# Patient Record
Sex: Male | Born: 1950
Health system: Southern US, Community
[De-identification: ages and names within clinical notes are randomized; demographics above are authoritative.]

## PROBLEM LIST (undated history)

## (undated) DIAGNOSIS — K635 Polyp of colon: Secondary | ICD-10-CM

## (undated) DIAGNOSIS — F419 Anxiety disorder, unspecified: Secondary | ICD-10-CM

## (undated) DIAGNOSIS — R7301 Impaired fasting glucose: Secondary | ICD-10-CM

## (undated) DIAGNOSIS — F988 Other specified behavioral and emotional disorders with onset usually occurring in childhood and adolescence: Secondary | ICD-10-CM

## (undated) DIAGNOSIS — J189 Pneumonia, unspecified organism: Secondary | ICD-10-CM

## (undated) DIAGNOSIS — T7840XA Allergy, unspecified, initial encounter: Secondary | ICD-10-CM

## (undated) DIAGNOSIS — C801 Malignant (primary) neoplasm, unspecified: Secondary | ICD-10-CM

## (undated) DIAGNOSIS — F329 Major depressive disorder, single episode, unspecified: Secondary | ICD-10-CM

## (undated) DIAGNOSIS — I10 Essential (primary) hypertension: Secondary | ICD-10-CM

## (undated) DIAGNOSIS — F32A Depression, unspecified: Secondary | ICD-10-CM

## (undated) DIAGNOSIS — E785 Hyperlipidemia, unspecified: Secondary | ICD-10-CM

## (undated) DIAGNOSIS — M1990 Unspecified osteoarthritis, unspecified site: Secondary | ICD-10-CM

## (undated) HISTORY — DX: Malignant (primary) neoplasm, unspecified: C80.1

## (undated) HISTORY — DX: Pneumonia, unspecified organism: J18.9

## (undated) HISTORY — DX: Allergy, unspecified, initial encounter: T78.40XA

## (undated) HISTORY — DX: Depression, unspecified: F32.A

## (undated) HISTORY — DX: Unspecified osteoarthritis, unspecified site: M19.90

## (undated) HISTORY — DX: Anxiety disorder, unspecified: F41.9

## (undated) HISTORY — DX: Essential (primary) hypertension: I10

## (undated) HISTORY — DX: Polyp of colon: K63.5

## (undated) HISTORY — PX: PILONIDAL CYST EXCISION: SHX744

## (undated) HISTORY — PX: VASECTOMY: SHX75

## (undated) HISTORY — DX: Hyperlipidemia, unspecified: E78.5

## (undated) HISTORY — DX: Impaired fasting glucose: R73.01

## (undated) HISTORY — DX: Major depressive disorder, single episode, unspecified: F32.9

## (undated) HISTORY — DX: Other specified behavioral and emotional disorders with onset usually occurring in childhood and adolescence: F98.8

## (undated) HISTORY — PX: OTHER SURGICAL HISTORY: SHX169

---

## 2004-07-18 ENCOUNTER — Ambulatory Visit: Payer: Self-pay | Admitting: Internal Medicine

## 2004-07-30 ENCOUNTER — Ambulatory Visit: Payer: Self-pay | Admitting: Internal Medicine

## 2004-08-03 ENCOUNTER — Ambulatory Visit: Payer: Self-pay | Admitting: Internal Medicine

## 2004-08-08 ENCOUNTER — Encounter (INDEPENDENT_AMBULATORY_CARE_PROVIDER_SITE_OTHER): Payer: Self-pay | Admitting: *Deleted

## 2004-08-08 ENCOUNTER — Ambulatory Visit: Payer: Self-pay | Admitting: Internal Medicine

## 2005-01-21 HISTORY — PX: OTHER SURGICAL HISTORY: SHX169

## 2006-02-19 ENCOUNTER — Ambulatory Visit: Payer: Self-pay | Admitting: Internal Medicine

## 2006-06-11 DIAGNOSIS — F988 Other specified behavioral and emotional disorders with onset usually occurring in childhood and adolescence: Secondary | ICD-10-CM | POA: Insufficient documentation

## 2006-06-11 DIAGNOSIS — F429 Obsessive-compulsive disorder, unspecified: Secondary | ICD-10-CM | POA: Insufficient documentation

## 2006-08-06 ENCOUNTER — Telehealth (INDEPENDENT_AMBULATORY_CARE_PROVIDER_SITE_OTHER): Payer: Self-pay | Admitting: *Deleted

## 2006-09-26 ENCOUNTER — Telehealth (INDEPENDENT_AMBULATORY_CARE_PROVIDER_SITE_OTHER): Payer: Self-pay | Admitting: *Deleted

## 2006-10-21 ENCOUNTER — Telehealth: Payer: Self-pay | Admitting: Internal Medicine

## 2006-12-09 ENCOUNTER — Telehealth (INDEPENDENT_AMBULATORY_CARE_PROVIDER_SITE_OTHER): Payer: Self-pay | Admitting: *Deleted

## 2007-01-01 ENCOUNTER — Ambulatory Visit: Payer: Self-pay | Admitting: Internal Medicine

## 2007-01-01 DIAGNOSIS — I10 Essential (primary) hypertension: Secondary | ICD-10-CM | POA: Insufficient documentation

## 2007-01-01 DIAGNOSIS — E8881 Metabolic syndrome: Secondary | ICD-10-CM

## 2007-01-01 DIAGNOSIS — Z720 Tobacco use: Secondary | ICD-10-CM

## 2007-01-01 DIAGNOSIS — E7800 Pure hypercholesterolemia, unspecified: Secondary | ICD-10-CM | POA: Insufficient documentation

## 2007-01-01 DIAGNOSIS — E782 Mixed hyperlipidemia: Secondary | ICD-10-CM

## 2007-01-01 DIAGNOSIS — F17210 Nicotine dependence, cigarettes, uncomplicated: Secondary | ICD-10-CM | POA: Insufficient documentation

## 2007-01-02 ENCOUNTER — Encounter (INDEPENDENT_AMBULATORY_CARE_PROVIDER_SITE_OTHER): Payer: Self-pay | Admitting: *Deleted

## 2007-01-05 ENCOUNTER — Encounter (INDEPENDENT_AMBULATORY_CARE_PROVIDER_SITE_OTHER): Payer: Self-pay | Admitting: *Deleted

## 2007-03-02 ENCOUNTER — Telehealth (INDEPENDENT_AMBULATORY_CARE_PROVIDER_SITE_OTHER): Payer: Self-pay | Admitting: *Deleted

## 2007-05-05 ENCOUNTER — Telehealth (INDEPENDENT_AMBULATORY_CARE_PROVIDER_SITE_OTHER): Payer: Self-pay | Admitting: *Deleted

## 2007-06-04 ENCOUNTER — Encounter: Payer: Self-pay | Admitting: Internal Medicine

## 2007-06-05 ENCOUNTER — Ambulatory Visit: Payer: Self-pay | Admitting: Internal Medicine

## 2007-06-05 LAB — CONVERTED CEMR LAB: Glucose, Bld: 104 mg/dL — ABNORMAL HIGH (ref 70–99)

## 2007-06-09 ENCOUNTER — Encounter: Payer: Self-pay | Admitting: Internal Medicine

## 2007-06-11 ENCOUNTER — Encounter (INDEPENDENT_AMBULATORY_CARE_PROVIDER_SITE_OTHER): Payer: Self-pay | Admitting: *Deleted

## 2007-06-16 ENCOUNTER — Encounter (INDEPENDENT_AMBULATORY_CARE_PROVIDER_SITE_OTHER): Payer: Self-pay | Admitting: *Deleted

## 2007-06-17 ENCOUNTER — Telehealth (INDEPENDENT_AMBULATORY_CARE_PROVIDER_SITE_OTHER): Payer: Self-pay | Admitting: *Deleted

## 2007-07-30 ENCOUNTER — Telehealth (INDEPENDENT_AMBULATORY_CARE_PROVIDER_SITE_OTHER): Payer: Self-pay | Admitting: *Deleted

## 2007-10-30 ENCOUNTER — Telehealth (INDEPENDENT_AMBULATORY_CARE_PROVIDER_SITE_OTHER): Payer: Self-pay | Admitting: *Deleted

## 2007-12-22 ENCOUNTER — Telehealth (INDEPENDENT_AMBULATORY_CARE_PROVIDER_SITE_OTHER): Payer: Self-pay | Admitting: *Deleted

## 2008-01-26 ENCOUNTER — Ambulatory Visit: Payer: Self-pay | Admitting: Internal Medicine

## 2008-01-26 DIAGNOSIS — D179 Benign lipomatous neoplasm, unspecified: Secondary | ICD-10-CM | POA: Insufficient documentation

## 2008-01-26 DIAGNOSIS — M6740 Ganglion, unspecified site: Secondary | ICD-10-CM | POA: Insufficient documentation

## 2008-02-01 ENCOUNTER — Encounter: Payer: Self-pay | Admitting: Internal Medicine

## 2008-02-12 ENCOUNTER — Telehealth (INDEPENDENT_AMBULATORY_CARE_PROVIDER_SITE_OTHER): Payer: Self-pay | Admitting: *Deleted

## 2008-02-12 ENCOUNTER — Ambulatory Visit (HOSPITAL_BASED_OUTPATIENT_CLINIC_OR_DEPARTMENT_OTHER): Admission: RE | Admit: 2008-02-12 | Discharge: 2008-02-12 | Payer: Self-pay | Admitting: Orthopedic Surgery

## 2008-02-19 ENCOUNTER — Encounter: Payer: Self-pay | Admitting: Internal Medicine

## 2008-05-13 ENCOUNTER — Encounter (INDEPENDENT_AMBULATORY_CARE_PROVIDER_SITE_OTHER): Payer: Self-pay | Admitting: *Deleted

## 2008-06-07 ENCOUNTER — Ambulatory Visit: Payer: Self-pay | Admitting: Internal Medicine

## 2008-06-07 DIAGNOSIS — Z860100 Personal history of colon polyps, unspecified: Secondary | ICD-10-CM | POA: Insufficient documentation

## 2008-08-22 ENCOUNTER — Telehealth (INDEPENDENT_AMBULATORY_CARE_PROVIDER_SITE_OTHER): Payer: Self-pay | Admitting: *Deleted

## 2008-10-07 ENCOUNTER — Ambulatory Visit: Payer: Self-pay | Admitting: Internal Medicine

## 2008-10-07 LAB — CONVERTED CEMR LAB: Rapid Strep: NEGATIVE

## 2008-10-12 ENCOUNTER — Ambulatory Visit: Payer: Self-pay | Admitting: Internal Medicine

## 2008-10-12 LAB — CONVERTED CEMR LAB
Glucose, Urine, Semiquant: NEGATIVE
Ketones, urine, test strip: NEGATIVE
Nitrite: NEGATIVE
Protein, U semiquant: NEGATIVE
Specific Gravity, Urine: <1.005
Urobilinogen, UA: NEGATIVE

## 2008-10-13 ENCOUNTER — Telehealth (INDEPENDENT_AMBULATORY_CARE_PROVIDER_SITE_OTHER): Payer: Self-pay | Admitting: *Deleted

## 2008-10-14 ENCOUNTER — Telehealth (INDEPENDENT_AMBULATORY_CARE_PROVIDER_SITE_OTHER): Payer: Self-pay | Admitting: *Deleted

## 2008-10-14 LAB — CONVERTED CEMR LAB: PSA: 3.78 ng/mL (ref 0.10–4.00)

## 2008-11-21 ENCOUNTER — Telehealth (INDEPENDENT_AMBULATORY_CARE_PROVIDER_SITE_OTHER): Payer: Self-pay | Admitting: *Deleted

## 2008-12-26 ENCOUNTER — Telehealth (INDEPENDENT_AMBULATORY_CARE_PROVIDER_SITE_OTHER): Payer: Self-pay | Admitting: *Deleted

## 2009-01-21 HISTORY — PX: CYSTECTOMY: SUR359

## 2009-02-24 ENCOUNTER — Telehealth (INDEPENDENT_AMBULATORY_CARE_PROVIDER_SITE_OTHER): Payer: Self-pay | Admitting: *Deleted

## 2009-05-22 ENCOUNTER — Telehealth (INDEPENDENT_AMBULATORY_CARE_PROVIDER_SITE_OTHER): Payer: Self-pay | Admitting: *Deleted

## 2009-07-19 ENCOUNTER — Encounter (INDEPENDENT_AMBULATORY_CARE_PROVIDER_SITE_OTHER): Payer: Self-pay | Admitting: *Deleted

## 2009-08-02 ENCOUNTER — Encounter: Payer: Self-pay | Admitting: Internal Medicine

## 2009-08-07 ENCOUNTER — Telehealth (INDEPENDENT_AMBULATORY_CARE_PROVIDER_SITE_OTHER): Payer: Self-pay | Admitting: *Deleted

## 2009-10-09 ENCOUNTER — Ambulatory Visit: Payer: Self-pay | Admitting: Internal Medicine

## 2009-10-09 DIAGNOSIS — M545 Low back pain: Secondary | ICD-10-CM

## 2009-10-11 ENCOUNTER — Ambulatory Visit: Payer: Self-pay | Admitting: Internal Medicine

## 2009-10-13 LAB — CONVERTED CEMR LAB
ALT: 41 units/L (ref 0–53)
AST: 31 units/L (ref 0–37)
Albumin: 4.2 g/dL (ref 3.5–5.2)
Alkaline Phosphatase: 61 units/L (ref 39–117)
BUN: 14 mg/dL (ref 6–23)
Basophils Absolute: 0.1 10*3/uL (ref 0.0–0.1)
Basophils Relative: 0.9 % (ref 0.0–3.0)
Calcium: 10.1 mg/dL (ref 8.4–10.5)
Chloride: 106 meq/L (ref 96–112)
Cholesterol: 200 mg/dL (ref 0–200)
Eosinophils Relative: 2.7 % (ref 0.0–5.0)
GFR calc non Af Amer: 81.82 mL/min (ref >60)
HCT: 45.7 % (ref 39.0–52.0)
Hemoglobin: 16.2 g/dL (ref 13.0–17.0)
Lymphocytes Relative: 36.2 % (ref 12.0–46.0)
Lymphs Abs: 2.7 10*3/uL (ref 0.7–4.0)
MCHC: 35.4 g/dL (ref 30.0–36.0)
Monocytes Absolute: 0.8 10*3/uL (ref 0.1–1.0)
Monocytes Relative: 10.3 % (ref 3.0–12.0)
Neutro Abs: 3.8 10*3/uL (ref 1.4–7.7)
Neutrophils Relative %: 49.9 % (ref 43.0–77.0)
PSA: 2.05 ng/mL (ref 0.10–4.00)
Platelets: 189 10*3/uL (ref 150.0–400.0)
RBC: 4.32 M/uL (ref 4.22–5.81)
RDW: 13.4 % (ref 11.5–14.6)
Sodium: 142 meq/L (ref 135–145)
Total Protein: 6.9 g/dL (ref 6.0–8.3)
Triglycerides: 133 mg/dL (ref 0.0–149.0)

## 2009-11-30 ENCOUNTER — Telehealth (INDEPENDENT_AMBULATORY_CARE_PROVIDER_SITE_OTHER): Payer: Self-pay | Admitting: *Deleted

## 2010-01-21 HISTORY — PX: ABSCESS DRAINAGE: SHX1119

## 2010-01-31 ENCOUNTER — Telehealth (INDEPENDENT_AMBULATORY_CARE_PROVIDER_SITE_OTHER): Payer: Self-pay | Admitting: *Deleted

## 2010-02-18 LAB — CONVERTED CEMR LAB
BUN: 12 mg/dL (ref 6–23)
Basophils Absolute: 0 10*3/uL (ref 0.0–0.1)
Calcium: 9.7 mg/dL (ref 8.4–10.5)
Cholesterol, target level: 200 mg/dL
Cholesterol: 195 mg/dL (ref 0–200)
Creatinine, Ser: 1.1 mg/dL (ref 0.4–1.5)
Direct LDL: 125.1 mg/dL
Eosinophils Absolute: 0.2 10*3/uL (ref 0.0–0.6)
GFR calc Af Amer: 90 mL/min
GFR calc non Af Amer: 74 mL/min
HCT: 45.3 % (ref 39.0–52.0)
HDL goal, serum: 40 mg/dL
Hemoglobin: 16 g/dL (ref 13.0–17.0)
Hgb A1c MFr Bld: 5.7 % (ref 4.6–6.0)
LDL Goal: 130 mg/dL
Lymphocytes Relative: 32.3 % (ref 12.0–46.0)
MCHC: 35.4 g/dL (ref 30.0–36.0)
Monocytes Absolute: 0.7 10*3/uL (ref 0.2–0.7)
Neutro Abs: 4.8 10*3/uL (ref 1.4–7.7)
Neutrophils Relative %: 56.8 % (ref 43.0–77.0)
PSA: 1.3 ng/mL (ref 0.10–4.00)
Platelets: 207 10*3/uL (ref 150–400)
Potassium: 4.7 meq/L (ref 3.5–5.1)
RBC: 4.41 M/uL (ref 4.22–5.81)
TSH: 1.34 microintl units/mL (ref 0.35–5.50)
Total Bilirubin: 0.9 mg/dL (ref 0.3–1.2)
Total Protein: 7.1 g/dL (ref 6.0–8.3)
VLDL: 47 mg/dL — ABNORMAL HIGH (ref 0–40)
WBC: 8.4 10*3/uL (ref 4.5–10.5)

## 2010-02-19 ENCOUNTER — Ambulatory Visit
Admission: RE | Admit: 2010-02-19 | Discharge: 2010-02-19 | Payer: Self-pay | Source: Home / Self Care | Attending: Family Medicine | Admitting: Family Medicine

## 2010-02-19 ENCOUNTER — Telehealth (INDEPENDENT_AMBULATORY_CARE_PROVIDER_SITE_OTHER): Payer: Self-pay | Admitting: *Deleted

## 2010-02-19 DIAGNOSIS — R079 Chest pain, unspecified: Secondary | ICD-10-CM | POA: Insufficient documentation

## 2010-02-22 NOTE — Letter (Signed)
Summary: Colonoscopy Letter  [facility]  [address] [place], [place] [address]   Phone: [phone]  Fax: [phone]      July 19, 2009 MRN: [id number]   Andrew Woodard [address] [place], [place]  [address]   Dear Mr. Kaucher,   According to your medical record, it is time for you to schedule a Colonoscopy. The American Cancer Society recommends this procedure as a method to detect early colon cancer. Patients with a family history of colon cancer, or a personal history of colon polyps or inflammatory bowel disease are at increased risk.  This letter has been generated based on the recommendations made at the time of your procedure. If you feel that in your particular situation this may no longer apply, please contact our office.  Please call our office at [phone] to schedule this appointment or to update your records at your earliest convenience.  Thank you for cooperating with [place] to provide you with the very best care possible.   Sincerely,  Wilhemina Bonito. Marina Goodell, M.D.  [facility] [phone]

## 2010-02-22 NOTE — Assessment & Plan Note (Signed)
Summary: cpx /cbs   Vital Signs:  Patient profile:   60 year old male Height:      70.5 inches Weight:      262 pounds BMI:     37.20 Temp:     98.0 degrees F oral Pulse rate:   72 / minute Resp:     16 per minute BP sitting:   124 / 80  (left arm) Cuff size:   large  Vitals Entered By: Shonna Chock CMA (October 09, 2009 9:52 AM)  CC: CPX , General Medical Evaluation, Back pain Comments Patient will update flu vaccine at work   CC:  CPX , General Medical Evaluation, and Back pain.  History of Present Illness: Mr. Andrew Woodard is here for a physical; he has had intermittent back pain with weakness in LLE & intermittent burning @ R hip.   The patient denies fever, chills, loss of sensation, fecal incontinence, urinary incontinence, urinary retention, dysuria, and inability to work.  The pain began gradually.  The pain is made worse by standing  for prolonged periods.  The pain is made better by muscle relaxants and opioids.  Risk factors for serious underlying conditions include significant trauma. PMH of 4 ruptured discs @ L4-5.He has been followed by Dr Chaney Malling & Madelon Lips.  Hypertension Follow-Up      The patient also presents for Hypertension follow-up.  The patient denies lightheadedness, urinary frequency, headaches, and fatigue.  The patient denies the following associated symptoms: chest pain, chest pressure, exercise intolerance, dyspnea, palpitations, syncope, leg edema, and pedal edema.  Compliance with medications (by patient report) has been near 100%.  Adjunctive measures currently used by the patient include salt restriction. BP @ home  high : 143/77-82 ; average 135/80.  Current Medications (verified): 1)  Ritalin  20 Mg  (Methylphenidate Hcl) .... One Tab Twice Daily Prn 2)  Sertraline Hcl 100 Mg Tabs (Sertraline Hcl) .Marland Kitchen.. 1 By Mouth Qd 3)  Hydrochlorothiazide 25 Mg  Tabs (Hydrochlorothiazide) .... 1/2 Tab Once Daily 4)  Metoprolol Succinate 50 Mg  Tb24 (Metoprolol  Succinate) .... Take One Tablet Daily 5)  Zestril 40 Mg Tabs (Lisinopril) .Marland Kitchen.. 1 By Mouth Once Daily 6)  Clotrimazole-Betamethasone 1-0.05 % Crea (Clotrimazole-Betamethasone) .... Apply Two Times A Day As Needed  Allergies: 1)  ! Pcn  Past History:  Past Medical History: ADD, Dr Electa Sniff, Psychologist elevated homosysteine with normal C - RP Fasting hyperglycemia Colonic polyps,PMH  of, hyperplastic Metabolic Syndrome (05/2007 LDL 04,[id number] 256)  Past Surgical History: fistula in ano resected, pilonidal cystectomy X 2 Vasectomy X2 Colonoscopy with  hyperplastic polys, tics 2006, Dr Marina Goodell , due 2011 Ganglionectomy R thumb; Cystectomy L knee; Abscess X2 , Dr Johna Sheriff  Family History: Father: depression,fractured hip, pneumonia , renal insufficiency, septic shock Mother:  [place], blind due to trauma M grandmother: colon cancer age 52 paternal grandfather: MI @ 80+ Siblings: negative  Social History: Current Smoker: one pack a day;modified  low carb diet Occupation:Sales Manager Married Alcohol use-yes : wine 3-4/ day Regular exercise-yes : 3-4X/week as walking & gym( interrupted by treatment of abscesses)  Review of Systems  The patient denies anorexia, fever, weight loss, weight gain, vision loss, decreased hearing, hoarseness, abdominal pain, melena, hematochezia, severe indigestion/heartburn, hematuria, suspicious skin lesions, unusual weight change, abnormal bleeding, enlarged lymph nodes, and angioedema.   Resp:  Complains of cough; denies coughing up blood, shortness of breath, sputum productive, and wheezing.  Physical Exam  General:  well-nourished,in no acute distress; alert,appropriate and cooperative  throughout examination Head:  Normocephalic and atraumatic without obvious abnormalities. No apparent alopecia  Eyes:  No corneal or conjunctival inflammation noted.  Perrla. Funduscopic exam benign, without hemorrhages, exudates or papilledema.  Ears:   External ear exam shows no significant lesions or deformities.  Otoscopic examination reveals clear canals, tympanic membranes are intact bilaterally without bulging, retraction, inflammation or discharge. Hearing is grossly normal bilaterally. Nose:  External nasal examination shows no deformity or inflammation. Nasal mucosa are pink and moist without lesions or exudates. Mouth:  Oral mucosa and oropharynx without lesions or exudates.  Teeth in good repair. Neck:  No deformities, masses, or tenderness noted. Lungs:  Normal respiratory effort, chest expands symmetrically. Lungs are clear to auscultation, no crackles or wheezes. Heart:  Normal rate and regular rhythm. S1 and S2 normal without gallop, murmur, click, rub .S 4 Abdomen:  Bowel sounds positive,abdomen soft and non-tender without masses, organomegaly or hernias noted. Rectal:  No external abnormalities noted. Normal sphincter tone. No rectal masses or tenderness. Genitalia:  Testes bilaterally descended without nodularity, tenderness or masses. No scrotal masses or lesions. No penis lesions or urethral discharge. Prostate:  Prostate gland firm and smooth, no enlargement, nodularity, tenderness, mass, asymmetry or induration, but body habitus limits complete exam. Msk:  No deformity or scoliosis noted of thoracic or lumbar spine.   Pulses:  R and L carotid,radial,dorsalis pedis and posterior tibial pulses are full and equal bilaterally Extremities:  No clubbing, cyanosis, edema, or deformity noted with normal full range of motion of all joints.   Neg  SLR Neurologic:  alert & oriented X3, strength normal in all extremities, gait (heel/toe)normal, and DTRs symmetrical and normal.   Skin:  Intact without suspicious lesions or rashes Cervical Nodes:  No lymphadenopathy noted Axillary Nodes:  No palpable lymphadenopathy Inguinal Nodes:  No significant adenopathy Psych:  memory intact for recent and remote, normally interactive, and good eye  contact.     Impression & Recommendations:  Problem # 1:  ROUTINE GENERAL MEDICAL EXAM@HEALTH  CARE FACL (ICD-V70.0)  Orders: EKG w/ Interpretation (93000)  Problem # 2:  LOW BACK PAIN SYNDROME (ICD-724.2) in context of PMH ruptured discs L4-5  Problem # 3:  UNSPECIFIED ESSENTIAL HYPERTENSION (ICD-401.9) controlled His updated medication list for this problem includes:    Hydrochlorothiazide 25 Mg Tabs (Hydrochlorothiazide) .Marland Kitchen... 1/2 tab once daily    Metoprolol Succinate 50 Mg Tb24 (Metoprolol succinate) .Marland Kitchen... Take one tablet daily    Zestril 40 Mg Tabs (Lisinopril) .Marland Kitchen... 1 by mouth once daily  Problem # 4:  HYPERLIPIDEMIA (ICD-272.2)  Problem # 5:  SMOKER (ICD-305.1) risks discussed  Problem # 6:  COLONIC POLYPS, HX OF (ICD-V12.72) as per Dr Marina Goodell  Complete Medication List: 1)  Ritalin 20 Mg (methylphenidate Hcl)  .... One tab twice daily prn 2)  Sertraline Hcl 100 Mg Tabs (Sertraline hcl) .Marland Kitchen.. 1 by mouth qd 3)  Hydrochlorothiazide 25 Mg Tabs (Hydrochlorothiazide) .... 1/2 tab once daily 4)  Metoprolol Succinate 50 Mg Tb24 (Metoprolol succinate) .... Take one tablet daily 5)  Zestril 40 Mg Tabs (Lisinopril) .Marland Kitchen.. 1 by mouth once daily 6)  Clotrimazole-betamethasone 1-0.05 % Crea (Clotrimazole-betamethasone) .... Apply two times a day as needed  Patient Instructions: 1)  Please schedule fasting labs; see diagnoses for Codes: 2)  BMP ; 3)  Hepatic Panel ; 4)  Lipid Panel ; 5)  TSH ; 6)  CBC w/ Diff ; 7)  PSA . 8)  Stop Smoking Tips: Choose a Quit date. Cut down before  the Quit date. decide what you will do as a substitute when you feel the urge to smoke(gum,toothpick,exercise). Prescriptions: SERTRALINE HCL 100 MG TABS (SERTRALINE HCL) 1 by mouth qd  #90 Tablet x 3   Entered and Authorized by:   Marga Melnick MD   Signed by:   Marga Melnick MD on 10/09/2009   Method used:   Print then Give to Patient   RxID:   [id number]

## 2010-02-22 NOTE — Progress Notes (Signed)
Summary: Refill Request  Phone Note Refill Request Call back at [phone]   Refills Requested: Medication #1:  RITALIN  20 MG  (METHYLPHENIDATE HCL) one tab twice daily prn call when ready      Method Requested: Pick up at Office Next Appointment Scheduled: no appts scheduled - last ov 12-26-08 Initial call taken by: Okey Regal [place],  May 22, 2009 2:03 PM  Follow-up for Phone Call        Patient aware rx is ready for pick-up Follow-up by: Shonna Chock,  May 22, 2009 2:43 PM    Prescriptions: RITALIN  20 MG  (METHYLPHENIDATE HCL) one tab twice daily prn  #60 x 0   Entered by:   Shonna Chock   Authorized by:   Marga Melnick MD   Signed by:   Shonna Chock on 05/22/2009   Method used:   Print then Give to Patient   RxID:   [id number]

## 2010-02-22 NOTE — Progress Notes (Signed)
Summary: Refill Request  Phone Note Refill Request Message from:  Pharmacy  Refills Requested: Medication #1:  SERTRALINE HCL 100 MG TABS 1 by mouth qd  Medication #2:  ZESTRIL 40 MG TABS 1 by mouth once daily  Medication #3:  HYDROCHLOROTHIAZIDE 25 MG  TABS 1/2 tab once daily  Medication #4:  METOPROLOL SUCCINATE 50 MG  TB24 TAKE ONE TABLET DAILY [facility]   Method Requested: Fax to [facility] Initial call taken by: Shonna Chock CMA,  January 31, 2010 9:40 AM    Prescriptions: ZESTRIL 40 MG TABS (LISINOPRIL) 1 by mouth once daily  #90 Tablet x 2   Entered by:   Shonna Chock CMA   Authorized by:   Marga Melnick MD   Signed by:   Shonna Chock CMA on 01/31/2010   Method used:   Faxed to ...       [facility]* (retail)             ,          Ph: [phone]       Fax: [phone]   RxID:   [id number] METOPROLOL SUCCINATE 50 MG  TB24 (METOPROLOL SUCCINATE) TAKE ONE TABLET DAILY  #90 Tablet x 2   Entered by:   Shonna Chock CMA   Authorized by:   Marga Melnick MD   Signed by:   Shonna Chock CMA on 01/31/2010   Method used:   Faxed to ...       [facility]* (retail)             ,          Ph: [phone]       Fax: [phone]   RxID:   [id number] HYDROCHLOROTHIAZIDE 25 MG  TABS (HYDROCHLOROTHIAZIDE) 1/2 tab once daily  #45 Tablet x 2   Entered by:   Shonna Chock CMA   Authorized by:   Marga Melnick MD   Signed by:   Shonna Chock CMA on 01/31/2010   Method used:   Faxed to ...       [facility]* (retail)             ,          Ph: [phone]       Fax: [phone]   RxID:   [id number] SERTRALINE HCL 100 MG TABS (SERTRALINE HCL) 1 by mouth qd  #90 Tablet x 2   Entered by:   Shonna Chock CMA   Authorized by:   Marga Melnick MD   Signed by:   Shonna Chock CMA on 01/31/2010   Method used:   Faxed to ...       [facility]* (retail)             ,          Ph: [phone]       Fax: [phone]   RxID:   [id number]

## 2010-02-22 NOTE — Miscellaneous (Signed)
Summary: Orders Update  Clinical Lists Changes  Orders: Added new Test order of T-2 View CXR (71020TC) - Signed 

## 2010-02-22 NOTE — Progress Notes (Signed)
Summary: Ritalin refill  Phone Note Refill Request Call back at [phone] Message from:  Patient on November 30, 2009 2:49 PM  Refills Requested: Medication #1:  RITALIN  20 MG  (METHYLPHENIDATE HCL) one tab twice daily prn Patient needs Ritalin prescription refilled.  Advised patient that it will be ready in 24 hours for pickup unless someone calls them  Initial call taken by: Jerolyn Shin,  November 30, 2009 2:50 PM    Prescriptions: RITALIN  20 MG  (METHYLPHENIDATE HCL) one tab twice daily prn  #60 x 0   Entered by:   Shonna Chock CMA   Authorized by:   Marga Melnick MD   Signed by:   Shonna Chock CMA on 11/30/2009   Method used:   Print then Give to Patient   RxID:   [id number]

## 2010-02-22 NOTE — Progress Notes (Signed)
Summary: Refill request  Phone Note Refill Request Call back at [phone] Message from:  Patient  Refills Requested: Medication #1:  RITALIN  20 MG  (METHYLPHENIDATE HCL) one tab twice daily prn  Method Requested: Pick up at Office Initial call taken by: Shonna Chock,  February 24, 2009 2:26 PM  Follow-up for Phone Call        Patient aware RX ready Follow-up by: Shonna Chock,  February 24, 2009 2:27 PM    Prescriptions: RITALIN  20 MG  (METHYLPHENIDATE HCL) one tab twice daily prn  #60 x 0   Entered by:   Shonna Chock   Authorized by:   Marga Melnick MD   Signed by:   Shonna Chock on 02/24/2009   Method used:   Print then Give to Patient   RxID:   [id number]

## 2010-02-22 NOTE — Progress Notes (Signed)
Summary: ritalin refill   Phone Note Refill Request Message from:  Patient on August 07, 2009 3:04 PM  Refills Requested: Medication #1:  RITALIN  20 MG  (METHYLPHENIDATE HCL) one tab twice daily prn   Dosage confirmed as above?Dosage Confirmed   Supply Requested: 1 month pt can be reached at [phone]   Method Requested: Pick up at Office Next Appointment Scheduled: Sept 19th 2011 Initial call taken by: Lavell Islam,  August 07, 2009 3:05 PM Caller: Patient Summary of Call: [phone]  Follow-up for Phone Call        Ritalin 20 mg lasted filled: 05/22/09   For :60 tabs  Follow-up by: Kathrynn Speed CMA,  August 07, 2009 4:32 PM  Additional Follow-up for Phone Call Additional follow up Details #1::        Pt has not been seen since 09/2008. Pt has pending CPX on 10/12/09. Army Fossa CMA  August 07, 2009 4:48 PM  patient aware prescription ready for pick up .....Marland KitchenMarland KitchenDoristine Devoid CMA  August 08, 2009 11:19 AM     Additional Follow-up for Phone Call Additional follow up Details #2::    OK X 1 Follow-up by: Marga Melnick MD,  August 08, 2009 8:02 AM  Prescriptions: RITALIN  20 MG  (METHYLPHENIDATE HCL) one tab twice daily prn  #60 x 0   Entered by:   Doristine Devoid CMA   Authorized by:   Marga Melnick MD   Signed by:   Doristine Devoid CMA on 08/08/2009   Method used:   Print then Give to Patient   RxID:   [id number]

## 2010-02-22 NOTE — Letter (Signed)
Summary: [facility]   Imported By: Lanelle Bal 09/11/2009 10:31:58  _____________________________________________________________________  External Attachment:    Type:   Image     Comment:   External Document

## 2010-02-28 NOTE — Progress Notes (Signed)
Summary: chest xray-  Phone Note Outgoing Call   Call placed by: Doristine Devoid CMA,  February 19, 2010 4:46 PM Call placed to: Patient Summary of Call: pt w/ possible early PNA.  Azithromycin should treat appropriately.  he should call if sxs don't improve or worsen.  Follow-up for Phone Call        left message on machine at work # since no answer or machine at home #.........Marland KitchenDoristine Devoid CMA  February 19, 2010 4:46 PM   Additional Follow-up for Phone Call Additional follow up Details #1::        Discuss with patient ...........Marland KitchenFelecia Deloach CMA  February 21, 2010 4:51 PM

## 2010-02-28 NOTE — Assessment & Plan Note (Signed)
Summary: pain with coughing/congested/kn   Vital Signs:  Patient profile:   60 year old male Weight:      268 pounds BMI:     38.05 Temp:     98.4 degrees F oral BP sitting:   120 / 76  (left arm)  Vitals Entered By: Doristine Devoid CMA (February 19, 2010 2:43 PM) CC: cough and chest congestion hurts to cough    History of Present Illness: 60 yo man here today for cough and chest congestion.  reports 'i've had a cold for awhile'.  cough is now productive.  'everytime i cough it's excrutiating'.  no pain w/ swinging golf club- pain is in R mid chest below breast.  pain w/ turning at night.  cough is becoming productive.  no fevers.  + nasal congestion.  no ear pain.  + sick contacts.  still smoking.  cough has been present for 3 weeks  painful big toe- Wednesday night very painful, red, swollen.  located along medial edge of R big toe.  painful to touch w/ sheet.  had large amount of lamb prior to sxs.  w/in 24-36 hrs was pain free.  Current Medications (verified): 1)  Ritalin 20 Mg Tabs (Methylphenidate Hcl) .... One Tab Twice Daily Prn 2)  Sertraline Hcl 100 Mg Tabs (Sertraline Hcl) .Marland Kitchen.. 1 By Mouth Qd 3)  Hydrochlorothiazide 25 Mg  Tabs (Hydrochlorothiazide) .... 1/2 Tab Once Daily 4)  Metoprolol Succinate 50 Mg  Tb24 (Metoprolol Succinate) .... Take One Tablet Daily 5)  Zestril 40 Mg Tabs (Lisinopril) .Marland Kitchen.. 1 By Mouth Once Daily 6)  Clotrimazole-Betamethasone 1-0.05 % Crea (Clotrimazole-Betamethasone) .... Apply Two Times A Day As Needed 7)  Azithromycin 250 Mg  Tabs (Azithromycin) .... 2 By  Mouth Today and Then 1 Daily For 4 Days 8)  Tessalon 200 Mg Caps (Benzonatate) .... Take One Capsule By Mouth Three Times A Day As Needed For Cough 9)  Cheratussin Ac 100-10 Mg/78ml Syrp (Guaifenesin-Codeine) .Marland Kitchen.. 1-2 Tsp Q4-6 As Needed For Cough.  Will Cause Drowsiness.  Disp  Allergies (verified): 1)  ! Pcn  Review of Systems      See HPI  Physical Exam  General:  well-nourished,in no  acute distress; alert,appropriate and cooperative throughout examination Head:  Normocephalic and atraumatic without obvious abnormalities. No apparent alopecia.  no TTP over sinuses Eyes:  no injxn or inflammation Ears:  External ear exam shows no significant lesions or deformities.  Otoscopic examination reveals clear canals, tympanic membranes are intact bilaterally without bulging, retraction, inflammation or discharge. Hearing is grossly normal bilaterally. Nose:  + congestion Mouth:  Oral mucosa and oropharynx without lesions or exudates.  Teeth in good repair. Neck:  No deformities, masses, or tenderness noted. Chest Wall:  + TTP along R inframammary line Lungs:  Normal respiratory effort, chest expands symmetrically. Lungs are clear to auscultation, no crackles or wheezes.  + hacking cough. Pulses:  +2 DP/PT Extremities:  no edema, erythema, or warmth of R great toe.  no pain w/ palpation or movement Cervical Nodes:  No lymphadenopathy noted   Impression & Recommendations:  Problem # 1:  COUGH (ICD-786.2) Assessment New given 3 weeks duration, fact that pt is a smoker, and productivity of cough will get CXR.  tx w/ Azithro.  start cough meds for sxs relief.  reviewed supportive care and red flags that should prompt return.  Pt expresses understanding and is in agreement w/ this plan. Orders: T-2 View CXR (71020TC)  Problem # 2:  CHEST PAIN (ICD-786.50) Assessment: New pt's pain consistent w/ muscle strain for severity of cough.  start scheduled NSAIDs for pain relief.  assess ribs on CXR.  reviewed supportive care and red flags that should prompt return.  Pt expresses understanding and is in agreement w/ this plan.  Problem # 3:  ? 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  sxs have completely resolved.  reviewed possible causes and treatments if this  occurs again.  Complete Medication List: 1)  Ritalin 20 Mg Tabs (Methylphenidate hcl) .... One tab twice daily prn 2)  Sertraline Hcl 100 Mg Tabs (Sertraline hcl) .Marland Kitchen.. 1 by mouth qd 3)  Hydrochlorothiazide 25 Mg Tabs (Hydrochlorothiazide) .... 1/2 tab once daily 4)  Metoprolol Succinate 50 Mg Tb24 (Metoprolol succinate) .... Take one tablet daily 5)  Zestril 40 Mg Tabs (Lisinopril) .Marland Kitchen.. 1 by mouth once daily 6)  Clotrimazole-betamethasone 1-0.05 % Crea (Clotrimazole-betamethasone) .... Apply two times a day as needed 7)  Azithromycin 250 Mg Tabs (Azithromycin) .... 2 by  mouth today and then 1 daily for 4 days 8)  Tessalon 200 Mg Caps (Benzonatate) .... Take one capsule by mouth three times a day as needed for cough 9)  Cheratussin Ac 100-10 Mg/56ml Syrp (Guaifenesin-codeine) .Marland Kitchen.. 1-2 tsp q4-6 as needed for cough.  will cause drowsiness.  disp  Patient Instructions: 1)  Please go to [address] for your chest xray 2)  Start the Azithromycin as directed for your bronchitis 3)  Use the Tessalon for daytime cough 4)  Use the Cheratussin nightly for cough 5)  Drink plenty of fluids 6)  Mucinex to thin your chest congestion 7)  Aleve- 2 pills two times a day w/ food or 1 pill every 8 hrs- for your chest wall pain 8)  Call with any questions or concerns 9)  Hang in there! Prescriptions: CHERATUSSIN AC 100-10 MG/5ML SYRP (GUAIFENESIN-CODEINE) 1-2 tsp Q4-6 as needed for cough.  will cause drowsiness.  disp  #150 x 0   Entered and Authorized by:   Neena Rhymes MD   Signed by:   Neena Rhymes MD on 02/19/2010   Method used:   Print then Give to Patient   RxID:   [id number] TESSALON 200 MG CAPS (BENZONATATE) Take one capsule by mouth three times a day as needed for cough  #60 x 0   Entered and Authorized by:   Neena Rhymes MD   Signed by:   Neena Rhymes MD on 02/19/2010   Method used:   Electronically to        [facility]  [phone]* (retail)       [address] [place], [place]  [address]       Ph: [phone] or [phone]       Fax: [phone]   RxID:   [id number] AZITHROMYCIN 250 MG  TABS (AZITHROMYCIN) 2 by  mouth today and then 1 daily for 4 days  #6 x 0   Entered and Authorized by:   Neena Rhymes MD   Signed by:   Neena Rhymes MD on 02/19/2010   Method used:   Electronically to        [facility]  [phone]* (retail)       [address] [place], [place]  [address]  Ph: [phone] or [phone]       Fax: [phone]   RxID:   [id number]    Orders Added: 1)  T-2 View CXR [71020TC] 2)  Est. Patient Level IV [56387]

## 2010-03-06 ENCOUNTER — Telehealth (INDEPENDENT_AMBULATORY_CARE_PROVIDER_SITE_OTHER): Payer: Self-pay | Admitting: *Deleted

## 2010-03-14 NOTE — Progress Notes (Signed)
Summary: ritalin refill  Phone Note Refill Request Message from:  Patient on March 06, 2010 9:01 AM  Refills Requested: Medication #1:  RITALIN 20 MG TABS one tab twice daily prn Patient needs Ritalin prescription refilled.  Advised patient that it will be ready in 24 hours for pickup unless someone calls them  Next Appointment Scheduled: none Initial call taken by: Jerolyn Shin,  March 06, 2010 9:01 AM    Prescriptions: RITALIN 20 MG TABS (METHYLPHENIDATE HCL) one tab twice daily prn  #60 x 0   Entered by:   Shonna Chock CMA   Authorized by:   Marga Melnick MD   Signed by:   Shonna Chock CMA on 03/06/2010   Method used:   Print then Give to Patient   RxID:   [id number]

## 2010-05-28 ENCOUNTER — Telehealth: Payer: Self-pay | Admitting: Internal Medicine

## 2010-05-28 MED ORDER — METHYLPHENIDATE HCL 20 MG PO TABS: 20.0000 mg | ORAL_TABLET | Freq: Two times a day (BID) | ORAL | Status: DC | PRN

## 2010-05-28 NOTE — Telephone Encounter (Signed)
RX placed at front for pick up .

## 2010-05-28 NOTE — Telephone Encounter (Signed)
Refill ritalin 20mg  - twice a day - patient will pick up (tuesday) [id number]

## 2010-06-05 NOTE — Op Note (Signed)
[name], Andrew Woodard               ACCOUNT NO.:  [id number]   MEDICAL RECORD NO.:  [id number]          PATIENT TYPE:  AMB   LOCATION:  DSC                          FACILITY:  [facility]   PHYSICIAN:  Katy Fitch. Sypher, M.D. DATE OF BIRTH:  [birth year]   DATE OF PROCEDURE:  02/12/2008  DATE OF DISCHARGE:                               OPERATIVE REPORT   PREOPERATIVE DIAGNOSIS:  Enlarging uncomfortable mass, dorsal aspect of  the right thumb interphalangeal joint and extensor mechanism with a  negative x-ray, rule out probable myxoid or ganglion cyst.   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   OPERATION:  Excisional biopsy of myxoid cyst from dorsal aspect of the  right thumb and extensor mechanism/interphalangeal joint.   OPERATING SURGEON:  Katy Fitch. Sypher, MD   ASSISTANT:  Marveen Reeks Dasnoit, PA-C   ANESTHESIA:  A 2% lidocaine, metacarpal head level block, right thumb.  This was performed in the minor operating room setting.   PROCEDURE:  Andrew Woodard is brought to the operating room and placed in  supine position on the operating table.   Following Betadine prep, a 2% lidocaine block was placed at the base of  the thumb metacarpal head level.  After 10 minutes, anesthesia was  excellent.  The right arm was then prepped with Betadine soap solution  and sterilely draped.  Following exsanguination of the thumb with a  gauze wrap, a 1/2-inch Penrose drain was placed over the proximal  phalangeal segment as a digital tourniquet.  Procedure commenced with a  transverse incision directly over the mass.  Subcutaneous tissues were  carefully divided taking care to gently dissect 2 large dorsal veins off  the mass.  The mass was extruding through the extensor mechanism.  This  was circumferentially dissected and resected  with a portion of the  extensor tendon.  We entered the IP joint and used a rongeur to clean  synovium.   Thereafter, the wound was repaired with mattress suture of 5-0 nylon.   There were no apparent complications.  Mr. Lenker was placed in  compressive dressing with Xeroflo sterile gauze and Coban.  We  will  change his dressing to a Band-Aid in 3 days.   We will see him back for followup in 1 week or sooner if any problems.     Katy Fitch Sypher, M.D.  Electronically Signed    RVS/MEDQ  D:  02/12/2008  T:  02/13/2008  Job:  [id number]

## 2010-06-28 ENCOUNTER — Other Ambulatory Visit: Payer: Self-pay | Admitting: Internal Medicine

## 2010-06-28 MED ORDER — ACETAMINOPHEN-CODEINE 300-30 MG PO TABS: 1.0000 | ORAL_TABLET | Freq: Four times a day (QID) | ORAL | Status: AC | PRN

## 2010-06-28 MED ORDER — CYCLOBENZAPRINE HCL 5 MG PO TABS: 5.0000 mg | ORAL_TABLET | Freq: Three times a day (TID) | ORAL | Status: AC | PRN

## 2010-06-28 NOTE — Telephone Encounter (Signed)
Dr.Hopper please advise, these meds was previously on med list in [facility] and were removed in Jan 2012 (patient stated no longer taking), please advise if ok to re-prescribe meds

## 2010-06-28 NOTE — Telephone Encounter (Signed)
OK X1 OV if no better

## 2010-08-21 ENCOUNTER — Other Ambulatory Visit: Payer: Self-pay | Admitting: Internal Medicine

## 2010-08-21 MED ORDER — METHYLPHENIDATE HCL 20 MG PO TABS: 20.0000 mg | ORAL_TABLET | Freq: Two times a day (BID) | ORAL | Status: DC | PRN

## 2010-08-21 NOTE — Telephone Encounter (Signed)
RX placed at the front for pick up

## 2010-08-21 NOTE — Telephone Encounter (Signed)
ritalin 20mg  twice daily- patient will pick up wed [phone]

## 2010-09-28 ENCOUNTER — Other Ambulatory Visit: Payer: Self-pay | Admitting: Internal Medicine

## 2010-09-28 MED ORDER — LISINOPRIL 40 MG PO TABS: 40.0000 mg | ORAL_TABLET | Freq: Every day | ORAL | Status: DC

## 2010-09-28 MED ORDER — METOPROLOL SUCCINATE ER 50 MG PO TB24: 50.0000 mg | ORAL_TABLET | Freq: Every day | ORAL | Status: DC

## 2010-09-28 MED ORDER — HYDROCHLOROTHIAZIDE 25 MG PO TABS: ORAL_TABLET | ORAL | Status: DC

## 2010-09-28 MED ORDER — SERTRALINE HCL 100 MG PO TABS: 100.0000 mg | ORAL_TABLET | Freq: Every day | ORAL | Status: DC

## 2010-09-28 NOTE — Telephone Encounter (Signed)
Patient needs to schedule  CPX  

## 2010-10-11 ENCOUNTER — Other Ambulatory Visit: Payer: Self-pay | Admitting: Internal Medicine

## 2010-10-11 MED ORDER — METHYLPHENIDATE HCL 20 MG PO TABS: 20.0000 mg | ORAL_TABLET | Freq: Two times a day (BID) | ORAL | Status: DC | PRN

## 2010-10-11 NOTE — Telephone Encounter (Signed)
rx sent

## 2010-11-20 ENCOUNTER — Telehealth: Payer: Self-pay | Admitting: Internal Medicine

## 2010-11-20 MED ORDER — METHYLPHENIDATE HCL 20 MG PO TABS: 20.0000 mg | ORAL_TABLET | Freq: Two times a day (BID) | ORAL | Status: DC | PRN

## 2010-11-20 NOTE — Telephone Encounter (Signed)
Refill ritalin - patient will pick up Thursday [id number]

## 2010-11-20 NOTE — Telephone Encounter (Signed)
Left message on voicemail informing patient rx placed at the front for pick-up

## 2010-12-27 ENCOUNTER — Telehealth: Payer: Self-pay | Admitting: Internal Medicine

## 2010-12-27 MED ORDER — METHYLPHENIDATE HCL 20 MG PO TABS: 20.0000 mg | ORAL_TABLET | Freq: Two times a day (BID) | ORAL | Status: DC | PRN

## 2010-12-27 NOTE — Telephone Encounter (Signed)
Refill ritalin - patient will pick up Monday [id number]

## 2010-12-27 NOTE — Telephone Encounter (Signed)
RX placed at the front, patient with pending appointment 01/16/2011

## 2011-01-04 ENCOUNTER — Other Ambulatory Visit: Payer: Self-pay | Admitting: Internal Medicine

## 2011-01-04 NOTE — Telephone Encounter (Signed)
Hopper pt please advise 

## 2011-01-04 NOTE — Telephone Encounter (Signed)
Per Dr.Hopper ok to fill #21, left message on voicemail informing patient we need a local pharmacy to seed med to.

## 2011-01-07 NOTE — Telephone Encounter (Signed)
Talked to the pharmacy late Friday and informed them ok to fill Tessalon

## 2011-01-11 ENCOUNTER — Encounter: Payer: Self-pay | Admitting: Internal Medicine

## 2011-01-16 ENCOUNTER — Encounter: Payer: Self-pay | Admitting: Internal Medicine

## 2011-01-16 ENCOUNTER — Ambulatory Visit (INDEPENDENT_AMBULATORY_CARE_PROVIDER_SITE_OTHER): Payer: Self-pay | Admitting: Internal Medicine

## 2011-01-16 VITALS — BP 124/82 | HR 70 | Temp 98.2°F | Resp 12 | Ht 70.0 in | Wt 264.4 lb

## 2011-01-16 DIAGNOSIS — Z0000 Encounter for general adult medical examination without abnormal findings: Secondary | ICD-10-CM

## 2011-01-16 NOTE — Patient Instructions (Signed)
Preventive Health Care: Exercise at least 30-45 minutes a day,  3-4 days a week.  Eat a low-fat diet with lots of fruits and vegetables, up to 7-9 servings per day. Avoid obesity; your goal is waist measurement < 40 inches.Consume less than 40 grams of sugar per day from foods & drinks with High Fructose Corn Sugar as # 1,2,3 or # 4 on label. Alcohol If you drink, do it moderately,less than 9 drinks per week, preferably less than 6 @ most. Consider  [facility]'s smoking cessation program @ www.[facility].com or 912-808-3700.   Please think about quitting smoking. Review the risks we discussed. Please call 1-800-QUIT-NOW  for free smoking cessation counseling. Please  schedule fasting Labs : BMET,Lipids, hepatic panel, CBC & dif, TSH, A1c. PLEASE BRING THESE INSTRUCTIONS TO FOLLOW UP  LAB APPOINTMENT.This will guarantee correct labs are drawn, eliminating need for repeat blood sampling ( needle sticks ! ). Diagnoses /Codes: V70.0

## 2011-01-16 NOTE — Progress Notes (Signed)
Subjective:    Patient ID: SHAW DOBEK, male    DOB: 06/27/1950, 60 y.o.   MRN: [id number]  HPI  Mr Vickers  is here for a physical;he denies acute issues.      Review of Systems HYPERTENSION; Disease Monitoring: Blood pressure - usually 145/75 Chest pain, palpitations- no       Dyspnea- no Medications: Compliance- yes  Lightheadedness,Syncope- occasional lightheadedness    Edema- no  FASTING HYPERGLYCEMIA: Disease Monitoring: Blood Sugar ranges-no monitored  Polyuria/phagia/dipsia- always thirsty       Visual problems- no Diet: no plan Exercise: 2-3 x / week as golf  HYPERLIPIDEMIA: Disease Monitoring: See symptoms for Hypertension Medications: Compliance- no statin    ROS: Abd pain, bowel changes-  Loose- watery stool on Sertraline   Muscle aches- no           Objective:   Physical Exam Gen.:  well-nourished in appearance. Alert, appropriate and cooperative throughout exam. Head: Normocephalic without obvious abnormalities;  no alopecia  Eyes: No corneal or conjunctival inflammation noted. Pupils equal round reactive to light and accommodation. Fundal exam is benign without hemorrhages, exudate, papilledema. Extraocular motion intact. Vision grossly normal. Ears: External  ear exam reveals no significant lesions or deformities. Canals clear .TMs normal. Hearing is grossly normal bilaterally. Nose: External nasal exam reveals no deformity or inflammation. Nasal mucosa are pink and moist. No lesions or exudates noted.   Mouth: Oral mucosa and oropharynx reveal no lesions or exudates but erythema of uvula. Teeth in good repair. Neck: No deformities, masses, or tenderness noted. Range of motion & . Thyroid normal. Lungs: Normal respiratory effort; chest expands symmetrically. Lungs are clear to auscultation without rales, wheezes, or increased work of breathing. Heart: Normal rate and rhythm. Normal S1 and S2. No gallop, click, or rub.  S 4 w/o murmur. Abdomen: Bowel  sounds normal; abdomen soft and nontender. No masses, organomegaly . Ventral  Hernia noted. Genitalia/ DRE : Genitalia unremarkable. Prostate is normal without enlargement, asymmetry, induration, or nodularity.                                                                                 Musculoskeletal/extremities: No deformity or scoliosis noted of  the thoracic or lumbar spine. No clubbing, cyanosis, edema, or deformity noted. Range of motion  normal .Tone & strength  normal.Joints normal. Nail health  good. Vascular: Carotid, radial artery, dorsalis pedis and  posterior tibial pulses are full and equal. No bruits present. Neurologic: Alert and oriented x3. Deep tendon reflexes symmetrical and normal.          Skin: Intact without suspicious lesions or rashes. Vascular nevus R hand Lymph: No cervical, axillary, or inguinal lymphadenopathy present. Psych: Mood and affect are normal. Normally interactive  Assessment & Plan:  #1 comprehensive physical exam; no acute findings #2 see Problem List with Assessments & Recommendations Plan: see Orders

## 2011-01-22 HISTORY — PX: OTHER SURGICAL HISTORY: SHX169

## 2011-01-31 ENCOUNTER — Other Ambulatory Visit: Payer: Self-pay | Admitting: Internal Medicine

## 2011-01-31 MED ORDER — METOPROLOL SUCCINATE ER 50 MG PO TB24: 50.0000 mg | ORAL_TABLET | Freq: Every day | ORAL | Status: DC

## 2011-01-31 MED ORDER — LISINOPRIL 40 MG PO TABS: 40.0000 mg | ORAL_TABLET | Freq: Every day | ORAL | Status: DC

## 2011-01-31 MED ORDER — SERTRALINE HCL 100 MG PO TABS: 100.0000 mg | ORAL_TABLET | Freq: Every day | ORAL | Status: DC

## 2011-01-31 MED ORDER — HYDROCHLOROTHIAZIDE 25 MG PO TABS: ORAL_TABLET | ORAL | Status: DC

## 2011-01-31 NOTE — Telephone Encounter (Signed)
RXs sent.

## 2011-02-08 ENCOUNTER — Telehealth: Payer: Self-pay | Admitting: Internal Medicine

## 2011-02-08 ENCOUNTER — Other Ambulatory Visit: Payer: Self-pay | Admitting: Internal Medicine

## 2011-02-08 MED ORDER — SERTRALINE HCL 100 MG PO TABS: 100.0000 mg | ORAL_TABLET | Freq: Every day | ORAL | Status: DC

## 2011-02-08 MED ORDER — LISINOPRIL 40 MG PO TABS: 40.0000 mg | ORAL_TABLET | Freq: Every day | ORAL | Status: DC

## 2011-02-08 NOTE — Telephone Encounter (Signed)
The pt called and is asking for four days of toprol-xl 50mg  and hydrodiuril 25mg  sent to [facility].  He states his mail order is ten days behind on getting the med out.   Thanks!

## 2011-02-08 NOTE — Telephone Encounter (Signed)
Please resend rx for hydrochlorothiazide tab to [facility].

## 2011-02-08 NOTE — Telephone Encounter (Signed)
Lisinopril, Sertraline and Metoprolol were also not received.

## 2011-02-08 NOTE — Telephone Encounter (Signed)
RX's printed and manually faxed to [facility]

## 2011-02-08 NOTE — Telephone Encounter (Signed)
RX's called in  

## 2011-02-08 NOTE — Telephone Encounter (Signed)
Addended by: Maurice Small on: 02/08/2011 04:01 PM   Modules accepted: Orders

## 2011-02-21 ENCOUNTER — Other Ambulatory Visit: Payer: Self-pay

## 2011-02-21 MED ORDER — METHYLPHENIDATE HCL 20 MG PO TABS: 20.0000 mg | ORAL_TABLET | Freq: Two times a day (BID) | ORAL | Status: DC | PRN

## 2011-02-21 NOTE — Telephone Encounter (Signed)
Patient aware rx ready for pick-up. 

## 2011-03-27 ENCOUNTER — Telehealth: Payer: Self-pay | Admitting: Internal Medicine

## 2011-04-05 NOTE — Telephone Encounter (Signed)
Recall Project: No answer/ No vmail option (7x rings)

## 2011-04-15 ENCOUNTER — Encounter: Payer: Self-pay | Admitting: Internal Medicine

## 2011-04-15 NOTE — Telephone Encounter (Signed)
Letter mailed

## 2011-05-02 IMAGING — CR DG CHEST 2V
3 series · 3 of 3 positions shown · non-contrast
Comparison: 10/11/2009

CLINICAL DATA: Cough

CHEST - 2 VIEW

[view not recorded (1 of 3)]
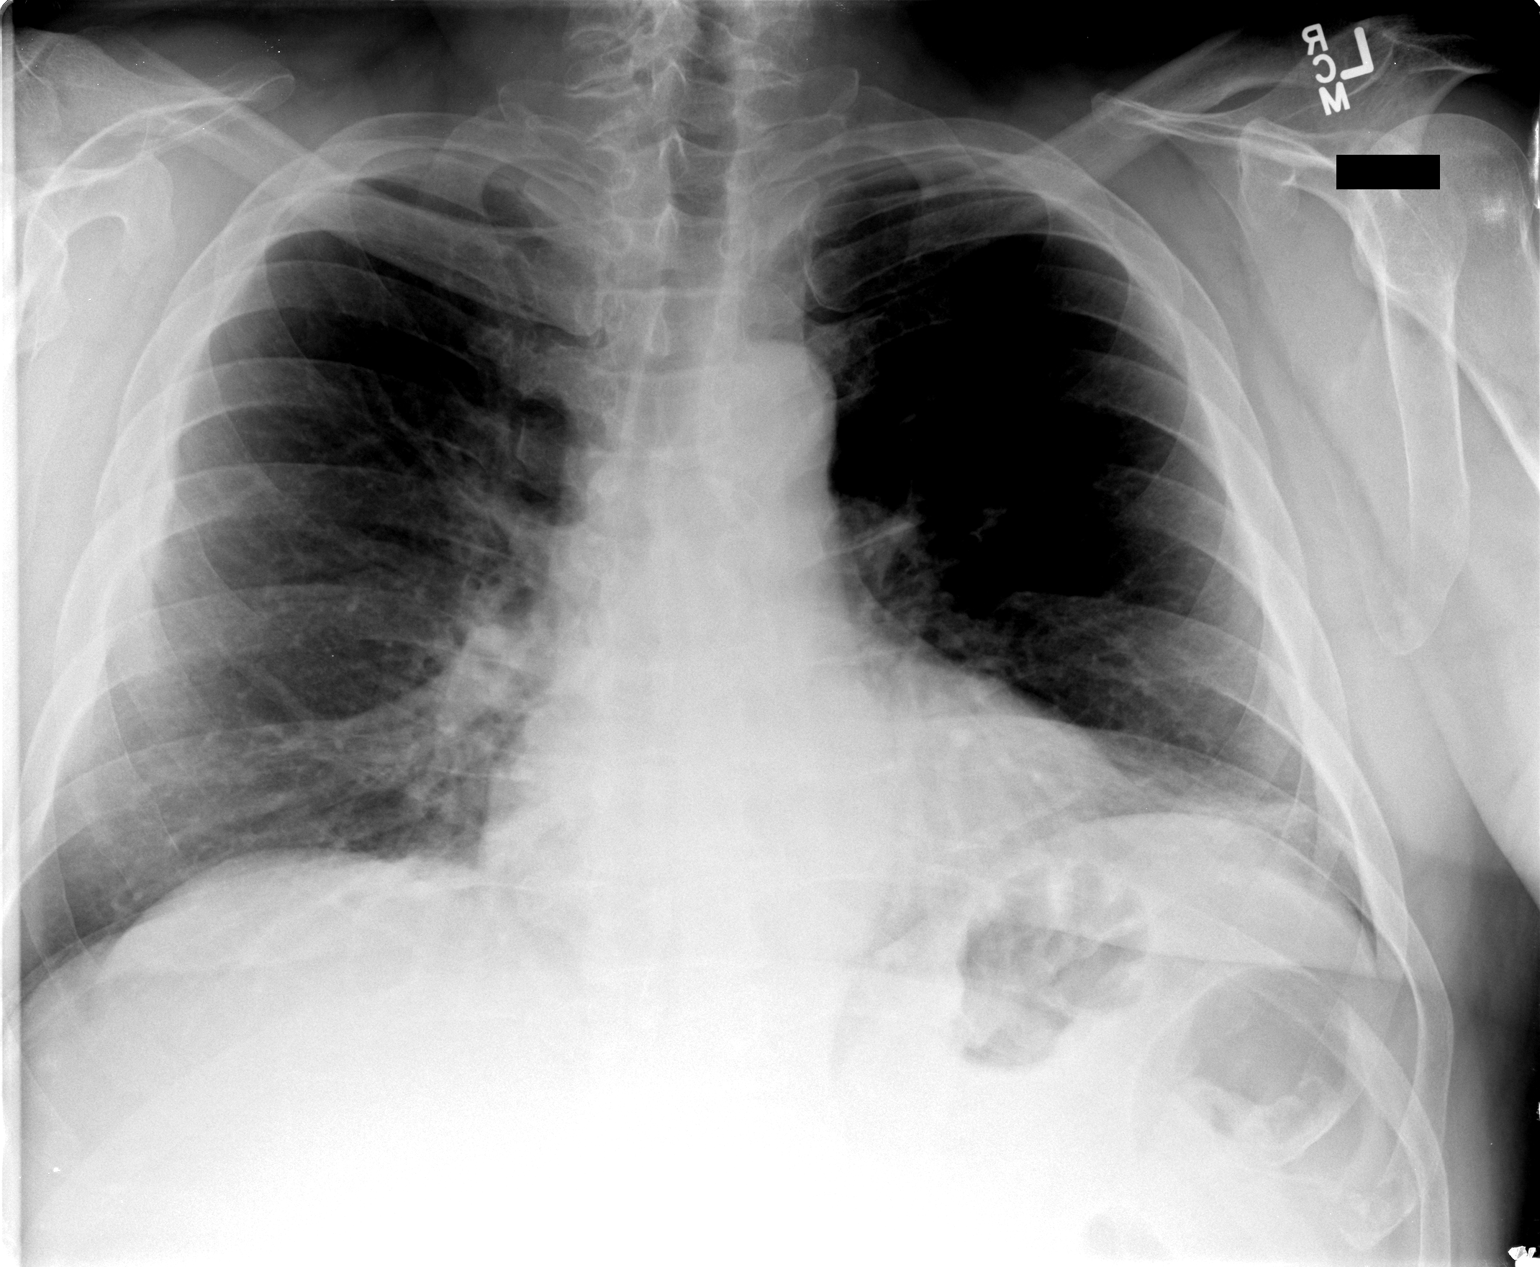

[view not recorded (2 of 3)]
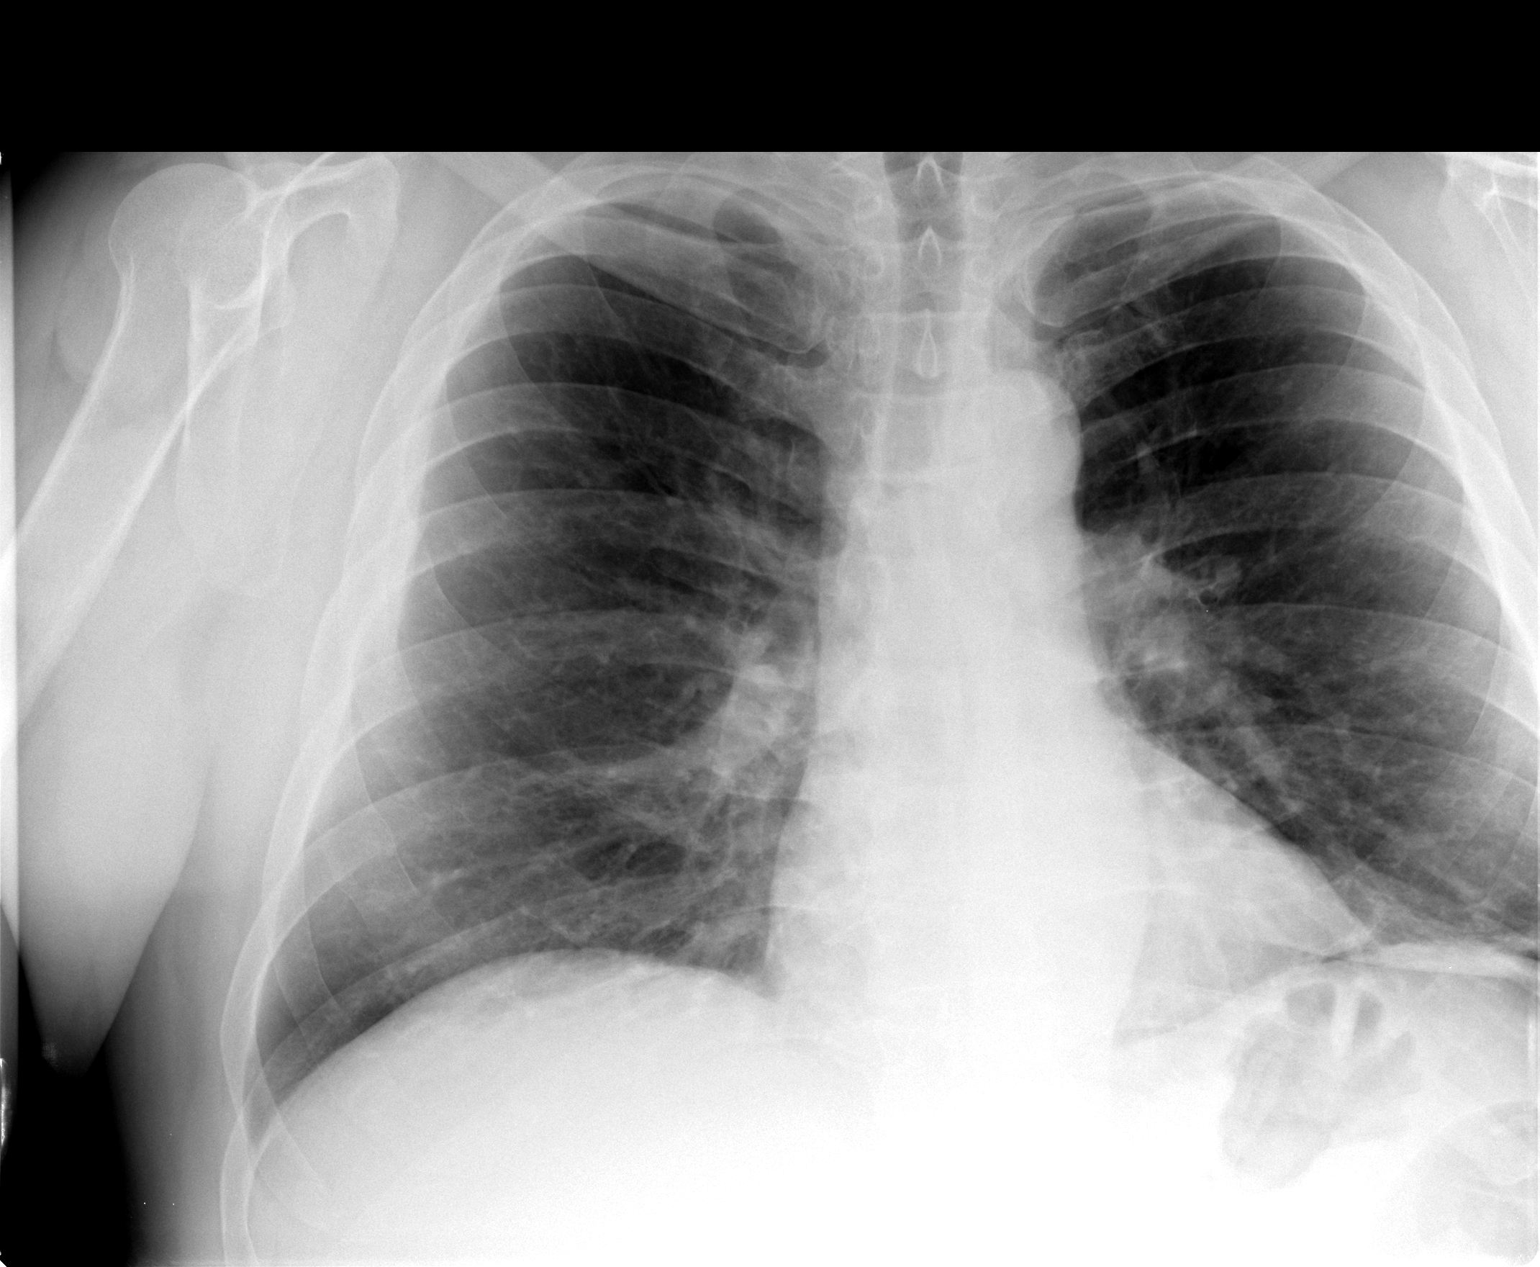

[view not recorded (3 of 3)]
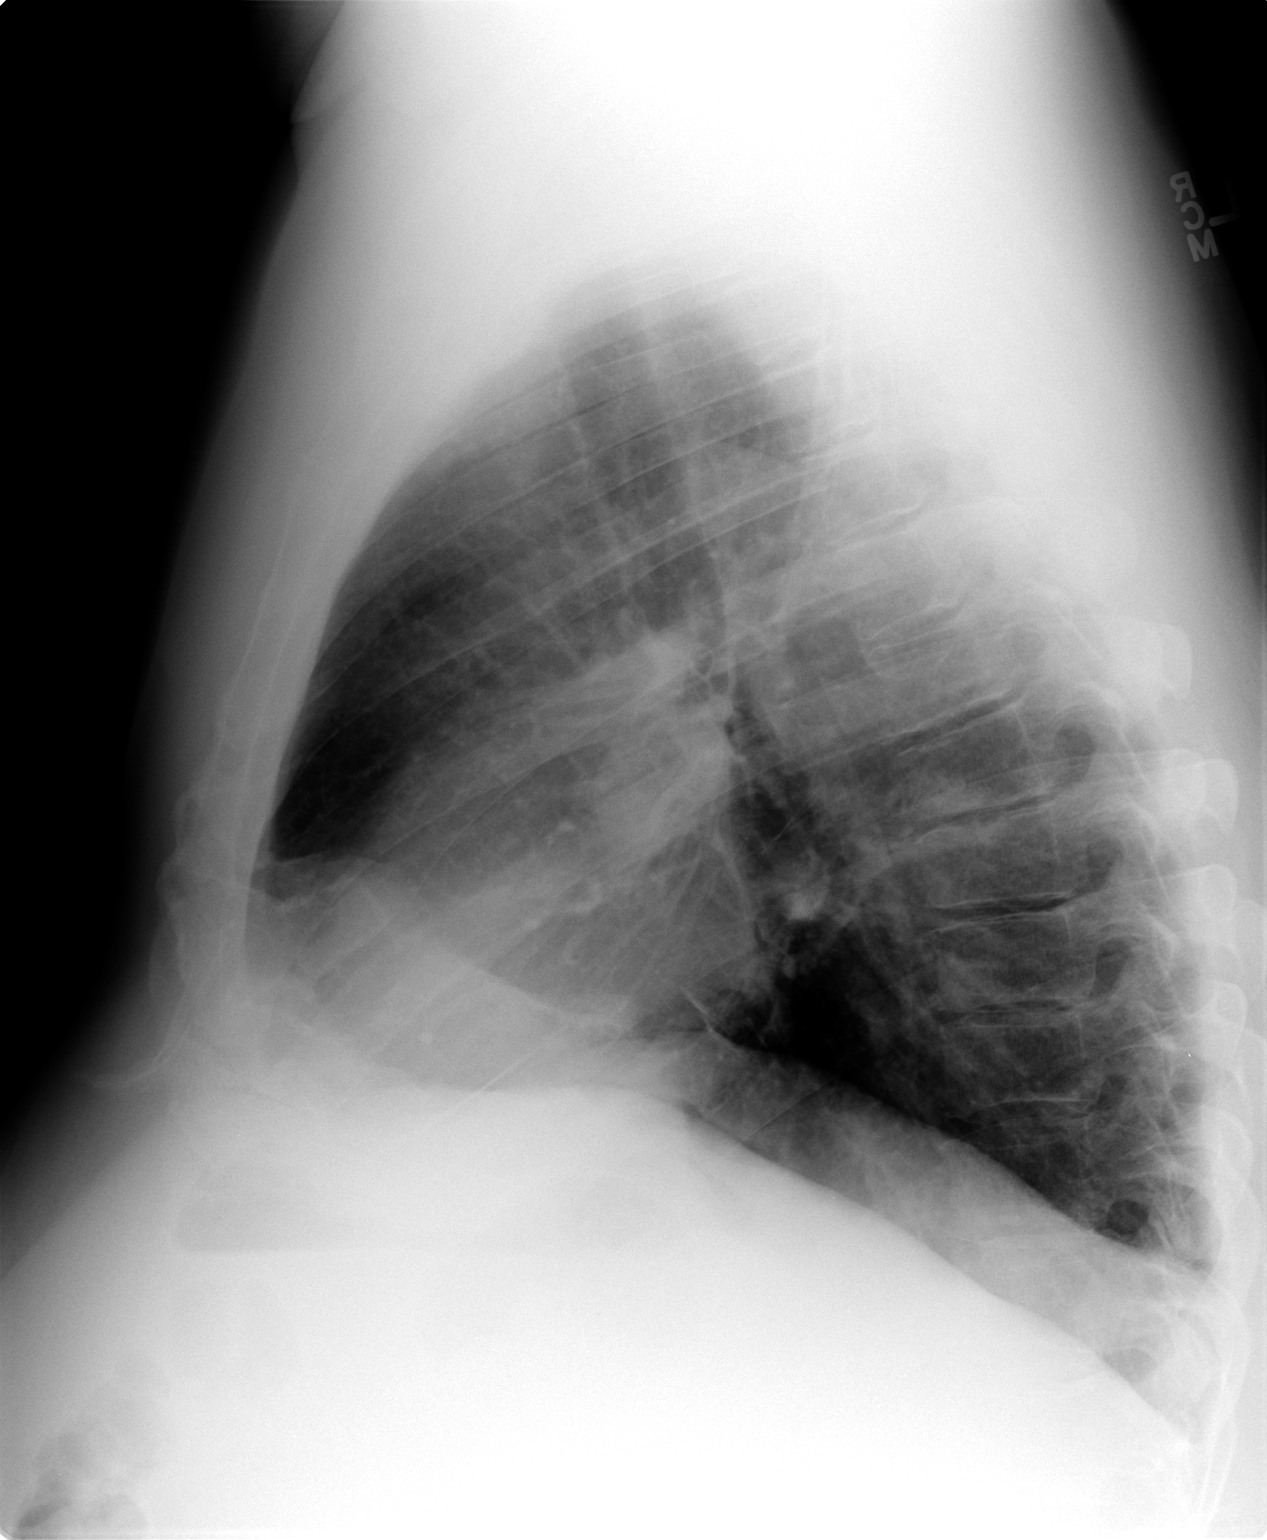

[3 of 3 positions shown; findings below may reference images not displayed]

FINDINGS: There is some subsegmental atelectasis versus early
infiltrate in the lingula.  Right lung clear.  Heart size upper
limits normal.  Mildly tortuous thoracic aorta.  No effusion.
Regional bones unremarkable.
IMPRESSION: 1.  Patchy lingular subsegmental atelectasis versus early
infiltrate.

## 2011-07-08 ENCOUNTER — Other Ambulatory Visit: Payer: Self-pay | Admitting: Internal Medicine

## 2011-07-08 MED ORDER — METHYLPHENIDATE HCL 20 MG PO TABS: 20.0000 mg | ORAL_TABLET | Freq: Two times a day (BID) | ORAL | Status: DC | PRN

## 2011-07-08 NOTE — Telephone Encounter (Signed)
Refill ritalin, call when ready at [phone] Last wrt 1.31.13, qty 60, take 1-tablet by mouth two times daily  Last ov 12.26.12

## 2011-07-08 NOTE — Telephone Encounter (Signed)
Rx ready for pick up., Pt aware. 

## 2011-09-11 ENCOUNTER — Other Ambulatory Visit: Payer: Self-pay | Admitting: *Deleted

## 2011-09-11 MED ORDER — METHYLPHENIDATE HCL 20 MG PO TABS: 20.0000 mg | ORAL_TABLET | Freq: Two times a day (BID) | ORAL | Status: DC | PRN

## 2011-09-11 NOTE — Telephone Encounter (Signed)
Refill request for Ritalin 20mg . Rx done & pt notified ready for pickup.

## 2011-09-17 ENCOUNTER — Encounter: Payer: Self-pay | Admitting: Internal Medicine

## 2011-09-17 ENCOUNTER — Ambulatory Visit (INDEPENDENT_AMBULATORY_CARE_PROVIDER_SITE_OTHER): Payer: BC Managed Care – PPO | Admitting: Internal Medicine

## 2011-09-17 VITALS — BP 114/68 | HR 64 | Temp 97.8°F | Wt 261.0 lb

## 2011-09-17 DIAGNOSIS — J329 Chronic sinusitis, unspecified: Secondary | ICD-10-CM

## 2011-09-17 MED ORDER — MOXIFLOXACIN HCL 400 MG PO TABS: 400.0000 mg | ORAL_TABLET | Freq: Every day | ORAL | Status: AC

## 2011-09-17 MED ORDER — HYDROCODONE-HOMATROPINE 5-1.5 MG/5ML PO SYRP: 5.0000 mL | ORAL_SOLUTION | Freq: Every evening | ORAL | Status: DC | PRN

## 2011-09-17 MED ORDER — AZELASTINE HCL 0.1 % NA SOLN: 2.0000 | Freq: Two times a day (BID) | NASAL | Status: DC

## 2011-09-17 NOTE — Progress Notes (Signed)
  Subjective:    Patient ID: Andrew Woodard, male    DOB: August 17, 1950, 61 y.o.   MRN: [id number]  HPI 61 year old gentleman with history of high cholesterol, hypertension, depression, ADD, tobacco abuse. Presents with 2 weeks history of runny nose, head and chest congestion, cough, sinus pain. He does have some dark green sputum production and nose discharge. Has not taken anything but some afrin and nasal irrigation.    Review of Systems Has subjective fevers at night, no chills, some sweating. Denies any vomiting or diarrhea, had some nausea last night he thinks related to mucous accumulation in the throat. Having occasional difficult time sleeping due to cough     Objective:   Physical Exam  Constitutional: He appears well-developed. No distress.  HENT:  Head: Normocephalic and atraumatic.    Right Ear: External ear normal.  Left Ear: External ear normal.  Mouth/Throat: Oropharynx is clear and moist.       Nose quite congested, maxillary sinuses tender  Cardiovascular: Normal rate, regular rhythm and normal heart sounds.   Pulmonary/Chest:       Few rhonchi, no wheezing, no respiratory distress  Skin: He is not diaphoretic.       Assessment & Plan:   Sinusitis, mild bronchitis, neck lymphadenopathy. Patient presents with symptoms consistent with sinusitis, he also has right-sided neck lymphadenopathy. He is allergic to penicillin. Plan: Avelox, hydrocodone at night to help him sleep, start Astelin for at least few days. I also recommend him to come back in 2 months to see his PCP regards the neck lymphadenopathy, I told the patient he is a heavy smoker and I always get concerned about lumps in the neck. He seemed to understand.

## 2011-09-17 NOTE — Patient Instructions (Signed)
Rest, fluids , tylenol For cough, take Mucinex DM twice a day as needed  If the cough continue, use hydrocodone, at night, will make you sleepy For congestion use astelin nasal spray twice a day until you feel better Take the antibiotic as prescribed  (avelox) Call if no better in few days Call anytime if the symptoms are severe  ----- Please come back in 2 months to see Hopp regards the lump on the right side of the neck. Definitely needs to be rechecked

## 2011-10-02 ENCOUNTER — Ambulatory Visit (INDEPENDENT_AMBULATORY_CARE_PROVIDER_SITE_OTHER): Payer: BC Managed Care – PPO | Admitting: Internal Medicine

## 2011-10-02 VITALS — BP 122/80 | HR 64 | Temp 97.9°F | Wt 262.0 lb

## 2011-10-02 DIAGNOSIS — J329 Chronic sinusitis, unspecified: Secondary | ICD-10-CM

## 2011-10-02 DIAGNOSIS — J40 Bronchitis, not specified as acute or chronic: Secondary | ICD-10-CM

## 2011-10-02 DIAGNOSIS — R05 Cough: Secondary | ICD-10-CM

## 2011-10-02 MED ORDER — HYDROCODONE-HOMATROPINE 5-1.5 MG/5ML PO SYRP: 5.0000 mL | ORAL_SOLUTION | Freq: Every evening | ORAL | Status: AC | PRN

## 2011-10-02 MED ORDER — DOXYCYCLINE HYCLATE 100 MG PO TABS: 100.0000 mg | ORAL_TABLET | Freq: Two times a day (BID) | ORAL | Status: AC

## 2011-10-02 MED ORDER — ALBUTEROL SULFATE HFA 108 (90 BASE) MCG/ACT IN AERS: 2.0000 | INHALATION_SPRAY | Freq: Four times a day (QID) | RESPIRATORY_TRACT | Status: DC | PRN

## 2011-10-02 NOTE — Patient Instructions (Addendum)
Continue with the nose spray Take doxycycline for one week Use Ventolin, a inhaler, for persistent cough, chest congestion or wheezing. ------- Please get your x-ray at the other [place]  office located at: [address] [place], across from [facility].  Please go to the basement, this is a walk-in facility, they are open from 8:30 to 5:30 PM. Phone number [phone]. -------  if you are not better in 2 weeks call, will refer you to a pulmonary doctor Quit tobacco is extremely important part of the treatment.

## 2011-10-02 NOTE — Progress Notes (Signed)
  Subjective:    Patient ID: Andrew Woodard, male    DOB: [birth year], 61 y.o.   MRN: [id number]  HPI Acute visit,  61 year old gentleman with history of high cholesterol, hypertension, depression, ADD, tobacco abuse Was seen about 2 weeks ago with respiratory symptoms, was prescribed Avelox. He is still coughing , is still having some green sputum. Some sore throat Runny nose has decrease, Astelin seems to be helping very well.  Past Medical History  Diagnosis Date  . ADD (attention deficit disorder)   . Fasting hyperglycemia      Review of Systems No fever or chills No wheezing, no hemoptysis Has a headache mostly frontal. Still smoking.     Objective:   Physical Exam  General -- alert, well-developed, and overweight appearing. No apparent distress.  Neck -- previously described lymphadenopathy has decreased in size to around 0.8 cm, not tender HEENT -- TMs normal, throat w/o redness, face symmetric and slt tender to palpation at all sinuses, nose  slt  congested   Lungs -- normal respiratory effort, few end expiratory wheezes, few rhonchi, no crackles, no increased work of breathing.  Heart-- normal rate, regular rhythm, no murmur, and no gallop.   Psych-- Cognition and judgment appear intact. Alert and cooperative with normal attention span and concentration.  not anxious appearing and not depressed appearing.         Assessment & Plan:   Sinusitis, mild bronchitis, neck lymphadenopathy. Patient was seen about 2 weeks ago with the above findings, status post Avelox. Still having cough, green sputum production. Still smoking. Neck lymphadenopathy improving Plan: Chest x-ray Continue Astelin which helped a great deal Doxycycline for a week Stop  tobacco Ventolin, hydrocodone as needed Patient to call in 2 weeks,  refer to pulmonary if not better

## 2011-10-03 ENCOUNTER — Encounter: Payer: Self-pay | Admitting: Internal Medicine

## 2012-01-22 HISTORY — PX: COLONOSCOPY: SHX174

## 2012-02-04 ENCOUNTER — Other Ambulatory Visit: Payer: Self-pay | Admitting: Internal Medicine

## 2012-02-04 MED ORDER — METOPROLOL SUCCINATE ER 50 MG PO TB24: ORAL_TABLET | ORAL | Status: DC

## 2012-02-04 MED ORDER — HYDROCHLOROTHIAZIDE 25 MG PO TABS: ORAL_TABLET | ORAL | Status: DC

## 2012-02-04 MED ORDER — SERTRALINE HCL 100 MG PO TABS: ORAL_TABLET | ORAL | Status: DC

## 2012-02-04 MED ORDER — LISINOPRIL 40 MG PO TABS: ORAL_TABLET | ORAL | Status: DC

## 2012-02-04 NOTE — Telephone Encounter (Signed)
Refills x 4  1-Lisinopril 40MG  Tab take 1 by mouth daily requesting 90-day supply no last fill date listed   2-Hydrochlorot tab 25mg  take 1/2 by mouth daily   requesting 90-day supply no last fill date listed   3-sertraline tab 100mg  take 1 by mouth daily  requesting 90-day supply no last fill date listed   4-Metoprolol succ er tab 50mg  take 1 by m outh daily  requesting 90-day supply no last fill date listed

## 2012-02-04 NOTE — Telephone Encounter (Signed)
Patient will need to schedule a CPX  

## 2012-02-05 ENCOUNTER — Telehealth: Payer: Self-pay | Admitting: Internal Medicine

## 2012-02-05 MED ORDER — METHYLPHENIDATE HCL 20 MG PO TABS: 20.0000 mg | ORAL_TABLET | Freq: Two times a day (BID) | ORAL | Status: DC | PRN

## 2012-02-05 NOTE — Telephone Encounter (Signed)
Patient aware rx is ready for pickup. 

## 2012-02-05 NOTE — Telephone Encounter (Signed)
PT CALLED scheduled CPE for 3.24.14 at 2:30--pt noted he needs refill on Ritlan cb # when ready [phone]

## 2012-04-13 ENCOUNTER — Encounter: Payer: Self-pay | Admitting: Internal Medicine

## 2012-04-13 ENCOUNTER — Ambulatory Visit (INDEPENDENT_AMBULATORY_CARE_PROVIDER_SITE_OTHER): Payer: BC Managed Care – PPO | Admitting: Internal Medicine

## 2012-04-13 VITALS — BP 130/84 | HR 74 | Temp 97.9°F | Resp 12 | Ht 70.5 in | Wt 268.0 lb

## 2012-04-13 DIAGNOSIS — E782 Mixed hyperlipidemia: Secondary | ICD-10-CM

## 2012-04-13 DIAGNOSIS — F988 Other specified behavioral and emotional disorders with onset usually occurring in childhood and adolescence: Secondary | ICD-10-CM

## 2012-04-13 DIAGNOSIS — J309 Allergic rhinitis, unspecified: Secondary | ICD-10-CM

## 2012-04-13 DIAGNOSIS — Z0000 Encounter for general adult medical examination without abnormal findings: Secondary | ICD-10-CM

## 2012-04-13 DIAGNOSIS — R9431 Abnormal electrocardiogram [ECG] [EKG]: Secondary | ICD-10-CM

## 2012-04-13 DIAGNOSIS — Z860100 Personal history of colon polyps, unspecified: Secondary | ICD-10-CM

## 2012-04-13 DIAGNOSIS — J302 Other seasonal allergic rhinitis: Secondary | ICD-10-CM

## 2012-04-13 MED ORDER — FLUTICASONE PROPIONATE 50 MCG/ACT NA SUSP: 1.0000 | Freq: Two times a day (BID) | NASAL | Status: DC | PRN

## 2012-04-13 MED ORDER — METHYLPHENIDATE HCL 20 MG PO TABS: 20.0000 mg | ORAL_TABLET | Freq: Two times a day (BID) | ORAL | Status: DC | PRN

## 2012-04-13 NOTE — Addendum Note (Signed)
Addended by: Maurice Small on: 04/13/2012 03:38 PM   Modules accepted: Orders

## 2012-04-13 NOTE — Progress Notes (Signed)
  Subjective:    Patient ID: Andrew Woodard, male    DOB: Jul 27, 1950, 62 y.o.   MRN: [id number]  HPI  He is here for a physical;acute issues include chronic lumbar DDD monitored by Dr Madelon Lips.     Review of Systems He is on a heart healthy diet; he exercises sporadically due to his back issues.  Specifically he denies chest pain, palpitations, dyspnea, or claudication. Family history is negative for premature coronary disease. Advanced cholesterol testing reveals his LDL goal was less than 100, ideally < 70.  For 3 days he's had some head congestion with postnasal drainage and cough with some discolored sputum. He denies significant itchy, watery eyes, or sneezing. He does have seasonal rhinoconjunctivitis symptoms or history     Objective:   Physical Exam Gen.: Healthy and well-nourished in appearance. Alert, appropriate and cooperative throughout exam. Appears younger than stated age  Head: Normocephalic without obvious abnormalities  Eyes: No corneal or conjunctival inflammation noted. Pupils equal round reactive to light and accommodation. Fundal exam is benign without hemorrhages, exudate, papilledema. Extraocular motion intact. Vision grossly normal without lenses Ears: External  ear exam reveals no significant lesions or deformities. Canals clear .TMs normal. Hearing is grossly normal bilaterally. Nose: External nasal exam reveals no deformity or inflammation. Nasal mucosa are pink and moist. No lesions or exudates noted.   Mouth: Oral mucosa and oropharynx reveal no lesions or exudates. Teeth in good repair. Neck: No deformities, masses, or tenderness noted. Range of motion & Thyroid normal. Lungs: Normal respiratory effort; chest expands symmetrically. Lungs are clear to auscultation without rales, wheezes, or increased work of breathing. Heart: Normal rate and rhythm. Normal S1 and S2. No gallop, click, or rub. Grade 1/2 over 6 systolic murmur. Abdomen: Bowel sounds normal; abdomen  soft and nontender. No masses, organomegaly or hernias noted. Genitalia: Genitalia normal except for left varices & vasectomy scar tissue. Prostate exam limited by habitus.External hemorrhoidal tissues.                           Musculoskeletal/extremities: No deformity or scoliosis noted of  the thoracic or lumbar spine.  No clubbing, cyanosis, edema, or significant extremity  deformity noted. Range of motion normal .Tone & strength  Normal. Joints normal . Nail health good. Able to lie down & sit up w/o help. Negative SLR bilaterally Vascular: Carotid, radial artery, dorsalis pedis and  posterior tibial pulses are full and equal. No bruits present. Neurologic: Alert and oriented x3. Deep tendon reflexes symmetrical and normal.        Skin: Intact without suspicious lesions or rashes. Keratosis R forearm. Lymph: No cervical, axillary, or inguinal lymphadenopathy present. Psych: Mood and affect are normal. Normally interactive                                                                                     Assessment & Plan:  #1 comprehensive physical exam; no acute findings  Plan: see Orders  & Recommendations

## 2012-04-13 NOTE — Patient Instructions (Addendum)
Preventive Health Care: Exercise at least 30-45 minutes a day,  3-4 days a week.  Eat a low-fat diet with lots of fruits and vegetables, up to 7-9 servings per day. This would eliminate the need for vitamin supplements. Consume less than 40 grams of sugar (preferably ZERO) per day from foods & drinks with High Fructose Corn Sugar as #1,2,3 or # 4 on label. Alcohol If you drink, do it moderately,less than 9 drinks per week, preferably less than 6 @ most. Please think about quitting smoking. Review the risks we discussed. Please call 1-800-QUIT-NOW (559-303-1038) for free smoking cessation counseling.  Plain Mucinex (NOT D) for thick secretions ;force NON dairy fluids .   Nasal cleansing in the shower as discussed with lather of mild shampoo.After 10 seconds wash off lather while  exhaling through nostrils. Make sure that all residual soap is removed to prevent irritation.  Fluticasone 1 spray in each nostril twice a day as needed. Use the "crossover" technique into opposite nostril spraying toward opposite ear @ 45 degree angle, not straight up into nostril.  Use a Neti pot daily only  as needed for significant sinus congestion; going from open side to congested side . Plain Allegra (NOT D )  160 daily , Loratidine 10 mg , OR Zyrtec 10 mg @ bedtime  as needed for itchy eyes & sneezing.   If you activate the  My Chart system; lab & Xray results will be released directly  to you as soon as I review & address these through the computer. If you choose not to sign up for My Chart within 36 hours of labs being drawn; results will be reviewed & interpretation added before being copied & mailed, causing a delay in getting the results to you.If you do not receive that report within 7-10 days ,please call. Additionally you can use this system to gain direct  access to your records  if  out of town or @ an office of a  physician who is not in  the My Chart network.  This improves continuity of care & places  you in control of your medical record.     Please  schedule fasting Labs : BMET,Lipids, hepatic panel, CBC & dif, TSH, PSA.PLEASE BRING THESE INSTRUCTIONS TO FOLLOW UP  LAB APPOINTMENT.This will guarantee correct labs are drawn, eliminating need for repeat blood sampling ( needle sticks ! ). Diagnoses /Codes: V70.0.  Order for x-rays entered into  the computer; these will be performed at [address] [facility]. across from [facility]. No appointment is necessary.EKG is normal but there are minor ST-T changes of early repolarization. These are normal variants but could be mistaken for acute injury. This EKG should be available for comparison if  seen emergently.  The serotonin reuptake inhibitor (citalopram) should be decreased by half dose if the Ritalin  is taken for ADD. It  could increase the relative dose of the serotonin reuptake inhibitor as they would both be metabolized through the liver.

## 2012-04-15 ENCOUNTER — Other Ambulatory Visit: Payer: Self-pay | Admitting: Internal Medicine

## 2012-04-15 DIAGNOSIS — Z86010 Personal history of colonic polyps: Secondary | ICD-10-CM

## 2012-04-17 ENCOUNTER — Encounter: Payer: Self-pay | Admitting: Internal Medicine

## 2012-05-06 ENCOUNTER — Telehealth: Payer: Self-pay | Admitting: Internal Medicine

## 2012-05-06 NOTE — Telephone Encounter (Signed)
Refill:  Lisinopril tab 40 mg. Take 1 by mouth daily appointment due. 90 day supply Sertraline tab 100 mg. Take 1 by mouth daily appointment due. 90 day supply Metoprolol suc tab 50 mg ER. Take 1 by mouth with or immediately following a meal daily appointment due. 90 day supply Hydrochlorothiazide tab 25 mg. Take 1 by mouth daily. 90 day supply

## 2012-05-07 MED ORDER — METOPROLOL SUCCINATE ER 50 MG PO TB24: ORAL_TABLET | ORAL | Status: DC

## 2012-05-07 MED ORDER — SERTRALINE HCL 100 MG PO TABS: ORAL_TABLET | ORAL | Status: DC

## 2012-05-07 MED ORDER — LISINOPRIL 40 MG PO TABS: ORAL_TABLET | ORAL | Status: DC

## 2012-05-07 MED ORDER — HYDROCHLOROTHIAZIDE 25 MG PO TABS: ORAL_TABLET | ORAL | Status: DC

## 2012-05-07 NOTE — Telephone Encounter (Signed)
RX's sent electronically to [facility]

## 2012-05-20 ENCOUNTER — Other Ambulatory Visit: Payer: Self-pay | Admitting: Internal Medicine

## 2012-05-20 NOTE — Telephone Encounter (Signed)
OK but OV if symptoms persist or progress

## 2012-05-20 NOTE — Telephone Encounter (Signed)
Discuss with patient, Rx sent. 

## 2012-05-20 NOTE — Telephone Encounter (Signed)
Hopp please advise on request for medication, last OV 04/13/2012

## 2012-05-20 NOTE — Telephone Encounter (Signed)
On Tessalon , do not fill. OVINB

## 2012-05-21 ENCOUNTER — Ambulatory Visit (AMBULATORY_SURGERY_CENTER): Payer: BC Managed Care – PPO | Admitting: *Deleted

## 2012-05-21 ENCOUNTER — Encounter: Payer: Self-pay | Admitting: Internal Medicine

## 2012-05-21 VITALS — Ht 72.0 in | Wt 268.4 lb

## 2012-05-21 DIAGNOSIS — Z86010 Personal history of colonic polyps: Secondary | ICD-10-CM

## 2012-05-21 DIAGNOSIS — Z1211 Encounter for screening for malignant neoplasm of colon: Secondary | ICD-10-CM

## 2012-05-21 MED ORDER — PEG-KCL-NACL-NASULF-NA ASC-C 100 G PO SOLR: ORAL | Status: DC

## 2012-05-21 NOTE — Telephone Encounter (Signed)
Discuss with patient. Pt states that he was just a littler confused because he thought we sent this in on yesterday. Advise Pt the tessalon was sent in to the pharmacy which is a capsule not a syrup. Pt ok will pick up Rx and call for OVINB.

## 2012-05-21 NOTE — Progress Notes (Signed)
No egg or soy allergy Pt registered in [facility] and access code given to pt 

## 2012-06-03 ENCOUNTER — Ambulatory Visit (INDEPENDENT_AMBULATORY_CARE_PROVIDER_SITE_OTHER)
Admission: RE | Admit: 2012-06-03 | Discharge: 2012-06-03 | Disposition: A | Discharge status: Home / Self Care | Payer: BC Managed Care – PPO | Source: Ambulatory Visit | Attending: Internal Medicine | Admitting: Internal Medicine

## 2012-06-03 ENCOUNTER — Telehealth: Payer: Self-pay | Admitting: Internal Medicine

## 2012-06-03 ENCOUNTER — Telehealth: Payer: Self-pay | Admitting: General Practice

## 2012-06-03 ENCOUNTER — Other Ambulatory Visit: Payer: Self-pay | Admitting: Internal Medicine

## 2012-06-03 ENCOUNTER — Other Ambulatory Visit (INDEPENDENT_AMBULATORY_CARE_PROVIDER_SITE_OTHER): Payer: BC Managed Care – PPO

## 2012-06-03 DIAGNOSIS — R9431 Abnormal electrocardiogram [ECG] [EKG]: Secondary | ICD-10-CM

## 2012-06-03 DIAGNOSIS — Z0000 Encounter for general adult medical examination without abnormal findings: Secondary | ICD-10-CM

## 2012-06-03 DIAGNOSIS — R911 Solitary pulmonary nodule: Secondary | ICD-10-CM

## 2012-06-03 DIAGNOSIS — E782 Mixed hyperlipidemia: Secondary | ICD-10-CM

## 2012-06-03 LAB — BASIC METABOLIC PANEL
BUN: 18 mg/dL (ref 6–23)
Calcium: 9.2 mg/dL (ref 8.4–10.5)
GFR: 72.63 mL/min (ref >60.00)
Potassium: 4.3 mEq/L (ref 3.5–5.1)
Sodium: 139 mEq/L (ref 135–145)

## 2012-06-03 LAB — CBC WITH DIFFERENTIAL/PLATELET
Basophils Absolute: 0.1 10*3/uL (ref 0.0–0.1)
Eosinophils Relative: 2.2 % (ref 0.0–5.0)
HCT: 51.9 % (ref 39.0–52.0)
Hemoglobin: 18 g/dL (ref 13.0–17.0)
Lymphocytes Relative: 37.8 % (ref 12.0–46.0)
Lymphs Abs: 2.8 10*3/uL (ref 0.7–4.0)
Monocytes Relative: 9.7 % (ref 3.0–12.0)
Platelets: 185 10*3/uL (ref 150.0–400.0)
RDW: 13.1 % (ref 11.5–14.6)
WBC: 7.4 10*3/uL (ref 4.5–10.5)

## 2012-06-03 LAB — LIPID PANEL
Cholesterol: 178 mg/dL (ref 0–200)
HDL: 37.9 mg/dL — ABNORMAL LOW (ref >39.00)
VLDL: 35.6 mg/dL (ref 0.0–40.0)

## 2012-06-03 LAB — HEPATIC FUNCTION PANEL
ALT: 37 U/L (ref 0–53)
AST: 26 U/L (ref 0–37)
Total Protein: 6.9 g/dL (ref 6.0–8.3)

## 2012-06-03 LAB — TSH: TSH: 1.37 u[IU]/mL (ref 0.35–5.50)

## 2012-06-03 NOTE — Telephone Encounter (Signed)
Patient called requesting results on his CT scan. I advised pt the results have not been reviewed by Dr. Alwyn Ren yet. Patient explained that he is about to start prepping for his colonoscopy at 5 and would like to get these results before then because he may not be able to talk on the phone. Call back on mobile #.

## 2012-06-03 NOTE — Telephone Encounter (Signed)
Pt notified of results by hopp. CT orders placed.

## 2012-06-03 NOTE — Telephone Encounter (Signed)
Preliminary results. In the lateral margin of the Right lung base, new nodular opacity measuring 1.2cm. Advise further evaluation with non contrast CT chest. Results available in Epic.

## 2012-06-04 ENCOUNTER — Ambulatory Visit (AMBULATORY_SURGERY_CENTER): Payer: BC Managed Care – PPO | Admitting: Internal Medicine

## 2012-06-04 ENCOUNTER — Encounter: Payer: Self-pay | Admitting: Internal Medicine

## 2012-06-04 VITALS — BP 130/87 | HR 62 | Temp 97.4°F | Resp 19 | Ht 72.0 in | Wt 268.0 lb

## 2012-06-04 DIAGNOSIS — Z86010 Personal history of colonic polyps: Secondary | ICD-10-CM

## 2012-06-04 DIAGNOSIS — Z1211 Encounter for screening for malignant neoplasm of colon: Secondary | ICD-10-CM

## 2012-06-04 MED ORDER — SODIUM CHLORIDE 0.9 % IV SOLN: 500.0000 mL | INTRAVENOUS | Status: DC

## 2012-06-04 NOTE — Telephone Encounter (Signed)
Spoke with patient, Andrew Woodard spoke with patient late yesterday afternoon and discussed results (copy of report to be mailed)

## 2012-06-04 NOTE — Patient Instructions (Addendum)
YOU HAD AN ENDOSCOPIC PROCEDURE TODAY AT THE [facility]: Refer to the procedure report that was given to you for any specific questions about what was found during the examination.  If the procedure report does not answer your questions, please call your gastroenterologist to clarify.  If you requested that your care partner not be given the details of your procedure findings, then the procedure report has been included in a sealed envelope for you to review at your convenience later.  YOU SHOULD EXPECT: Some feelings of bloating in the abdomen. Passage of more gas than usual.  Walking can help get rid of the air that was put into your GI tract during the procedure and reduce the bloating. If you had a lower endoscopy (such as a colonoscopy or flexible sigmoidoscopy) you may notice spotting of blood in your stool or on the toilet paper. If you underwent a bowel prep for your procedure, then you may not have a normal bowel movement for a few days.  DIET: Your first meal following the procedure should be a light meal and then it is ok to progress to your normal diet.  A half-sandwich or bowl of soup is an example of a good first meal.  Heavy or fried foods are harder to digest and may make you feel nauseous or bloated.  Likewise meals heavy in dairy and vegetables can cause extra gas to form and this can also increase the bloating.  Drink plenty of fluids but you should avoid alcoholic beverages for 24 hours.  ACTIVITY: Your care partner should take you home directly after the procedure.  You should plan to take it easy, moving slowly for the rest of the day.  You can resume normal activity the day after the procedure however you should NOT DRIVE or use heavy machinery for 24 hours (because of the sedation medicines used during the test).    SYMPTOMS TO REPORT IMMEDIATELY: A gastroenterologist can be reached at any hour.  During normal business hours, 8:30 AM to 5:00 PM Monday through Friday,  call [phone].  After hours and on weekends, please call the GI answering service at [phone] who will take a message and have the physician on call contact you.   Following lower endoscopy (colonoscopy or flexible sigmoidoscopy):  Excessive amounts of blood in the stool  Significant tenderness or worsening of abdominal pains  Swelling of the abdomen that is new, acute  Fever of 100F or higher   FOLLOW UP: If any biopsies were taken you will be contacted by phone or by letter within the next 1-3 weeks.  Call your gastroenterologist if you have not heard about the biopsies in 3 weeks.  Our staff will call the home number listed on your records the next business day following your procedure to check on you and address any questions or concerns that you may have at that time regarding the information given to you following your procedure. This is a courtesy call and so if there is no answer at the home number and we have not heard from you through the emergency physician on call, we will assume that you have returned to your regular daily activities without incident.  SIGNATURES/CONFIDENTIALITY: You and/or your care partner have paperwork which will be entered into your electronic medical record.  These signatures attest to the fact that that the information above on your After Visit Summary has been reviewed and is understood.  Full responsibility of the confidentiality of this  discharge information lies with you and/or your care-partner.  Diverticulosis, high fiber diet-handouts given  Repeat colonoscopy in 10 years

## 2012-06-04 NOTE — Telephone Encounter (Signed)
Left message on voicemail for patient to return call when available. Reason for call- give results:  Thankfully the nodule proved to be an artifact due to thickening of the pleura or lining of the lung and diaphragm. There is a 3 mm nodule in the right lower lobe which should be reevaluated in 1 year because you are a smoker to rule out progressive increase in size.  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 [employer]

## 2012-06-04 NOTE — Op Note (Signed)
[facility] [address] [place], [address]   COLONOSCOPY PROCEDURE REPORT  PATIENT: Andrew Woodard, Andrew Woodard  MR#: [id number] BIRTHDATE: Sep 11, 1950 , 61  yrs. old GENDER: Male ENDOSCOPIST: Roxy Cedar, MD REFERRED MV:[id number] Program Recall PROCEDURE DATE:  06/04/2012 PROCEDURE:   Colonoscopy, screening ASA CLASS:   Class II INDICATIONS:Patient's personal history of colon polyps.   Index - HP 2006 MEDICATIONS: MAC sedation, administered by CRNA and propofol (Diprivan) 340mg  IV  DESCRIPTION OF PROCEDURE:   After the risks benefits and alternatives of the procedure were thoroughly explained, informed consent was obtained.  A digital rectal exam revealed no abnormalities of the rectum.   The LB CF-[id number]  endoscope was introduced through the anus and advanced to the cecum, which was identified by both the appendix and ileocecal valve. No adverse events experienced.   The quality of the prep was excellent, using MoviPrep  The instrument was then slowly withdrawn as the colon was fully examined.      COLON FINDINGS: Moderate diverticulosis was noted in the ascending colon and sigmoid colon.   The colon was otherwise normal.  There was no inflammation, polyps or cancers .  Retroflexed views revealed no abnormalities. The time to cecum=2 minutes 28 seconds. Withdrawal time=10 minutes 33 seconds.  The scope was withdrawn and the procedure completed.  COMPLICATIONS: There were no complications.  ENDOSCOPIC IMPRESSION: 1.   Moderate diverticulosis was noted in the ascending colon and sigmoid colon 2.   The colon was otherwise normal  RECOMMENDATIONS: 1. Continue current colorectal screening recommendations for "routine risk" patients with a repeat colonoscopy in 10 years.   eSigned:  Roxy Cedar, MD 06/04/2012 9:10 AM   cc: Pecola Lawless, MD and The Patient

## 2012-06-04 NOTE — Progress Notes (Signed)
Patient did not experience any of the following events: a burn prior to discharge; a fall within the facility; wrong site/side/patient/procedure/implant event; or a hospital transfer or hospital admission upon discharge from the facility. (G8907)Patient did not have preoperative order for IV antibiotic SSI prophylaxis. [phone])  Pt was unable to pass any air while in recovery, pt denies pain, cramping, or bloating feeling, pt states he feels fine, while in recovery had pt lay on right side, left side, while trendelenburg, pt still passed no air, after recovery time was completed had pt sit in restroom to see if any air would pass, pt states he did pass a little air, pt still denies any pain, cramping or bloating, let pt go home

## 2012-06-05 ENCOUNTER — Telehealth: Payer: Self-pay | Admitting: *Deleted

## 2012-06-05 NOTE — Telephone Encounter (Signed)
  Follow up Call-  Call back number 06/04/2012  Post procedure Call Back phone  # [phone]  Permission to leave phone message Yes     Patient questions:  Do you have a fever, pain , or abdominal swelling? no Pain Score  0 *  Have you tolerated food without any problems? yes  Have you been able to return to your normal activities? yes  Do you have any questions about your discharge instructions: Diet   no Medications  no Follow up visit  no  Do you have questions or concerns about your Care? no  Actions: * If pain score is 4 or above: No action needed, pain <4.

## 2012-06-08 ENCOUNTER — Ambulatory Visit: Payer: BC Managed Care – PPO

## 2012-06-08 DIAGNOSIS — R7309 Other abnormal glucose: Secondary | ICD-10-CM

## 2012-06-09 LAB — HEMOGLOBIN A1C: Hgb A1c MFr Bld: 5.7 % (ref 4.6–6.5)

## 2012-06-18 ENCOUNTER — Encounter (INDEPENDENT_AMBULATORY_CARE_PROVIDER_SITE_OTHER): Payer: Self-pay | Admitting: General Surgery

## 2012-06-18 ENCOUNTER — Ambulatory Visit (INDEPENDENT_AMBULATORY_CARE_PROVIDER_SITE_OTHER): Payer: BC Managed Care – PPO | Admitting: General Surgery

## 2012-06-18 VITALS — BP 140/82 | HR 68 | Temp 97.4°F | Resp 16 | Ht 71.0 in | Wt 264.0 lb

## 2012-06-18 DIAGNOSIS — L723 Sebaceous cyst: Secondary | ICD-10-CM

## 2012-06-18 MED ORDER — SULFAMETHOXAZOLE-TRIMETHOPRIM 800-160 MG PO TABS: 1.0000 | ORAL_TABLET | Freq: Two times a day (BID) | ORAL | Status: DC

## 2012-06-18 NOTE — Progress Notes (Signed)
Subjective:     Patient ID: Andrew Woodard, male   DOB: 10-18-1950, 62 y.o.   MRN: [id number]  HPI This is a 62 year old male has a history of multiple abscesses. He presents with a couple of months of a left groin mass that has recently gotten much bigger and more tender. He has had some drainage as well. He denies any fevers. He comes in today to have this evaluated.  Review of Systems     Objective:   Physical Exam    Left groin infected sebaceous cyst with some purulent drainage from a couple of pinholes, this is fluctuant and tender Assessment:     Left groin sebaceous cyst, infected     Plan:     I discussed incising and draining this. I told him that the cyst certainly is not going to be taken care of today may need to be excised in the future. I anesthetize the area after cleansing with Betadine. There was a fair amount of purulent fluid that returned. I then packed this with iodoform. I instructed him to take this out in 48 hours and then may shower and let it heal. A dressing was placed. I will put him in sore for some antibiotics. He will come back if he notices a mass or any trouble with his healing.

## 2012-09-15 ENCOUNTER — Telehealth: Payer: Self-pay | Admitting: Internal Medicine

## 2012-09-15 DIAGNOSIS — F988 Other specified behavioral and emotional disorders with onset usually occurring in childhood and adolescence: Secondary | ICD-10-CM

## 2012-09-15 NOTE — Telephone Encounter (Signed)
Patient is calling to request a refill on his Ritalin rx. Please advise.

## 2012-09-16 NOTE — Telephone Encounter (Signed)
Last refill:04-13-12 Last OV:04-13-12 UDS:02-05-12-Low Risk-needs UDS Please advise.//AB/CMA

## 2012-09-17 MED ORDER — METHYLPHENIDATE HCL 20 MG PO TABS: 20.0000 mg | ORAL_TABLET | Freq: Two times a day (BID) | ORAL | Status: DC | PRN

## 2012-09-17 NOTE — Telephone Encounter (Signed)
Placed at font desk for pick up.

## 2012-09-17 NOTE — Telephone Encounter (Signed)
Ok for 1 month supply 

## 2012-09-17 NOTE — Telephone Encounter (Signed)
Med signed by Laury Axon. Due to Beverely Low being out of office.

## 2012-11-09 ENCOUNTER — Other Ambulatory Visit: Payer: Self-pay | Admitting: Internal Medicine

## 2012-11-10 ENCOUNTER — Telehealth: Payer: Self-pay | Admitting: *Deleted

## 2012-11-10 NOTE — Telephone Encounter (Signed)
Refill for tessalon pearles sent to [facility] [address]

## 2012-12-03 ENCOUNTER — Other Ambulatory Visit: Payer: Self-pay | Admitting: *Deleted

## 2012-12-03 ENCOUNTER — Telehealth: Payer: Self-pay | Admitting: *Deleted

## 2012-12-03 DIAGNOSIS — F988 Other specified behavioral and emotional disorders with onset usually occurring in childhood and adolescence: Secondary | ICD-10-CM

## 2012-12-03 MED ORDER — METHYLPHENIDATE HCL 20 MG PO TABS: 20.0000 mg | ORAL_TABLET | Freq: Two times a day (BID) | ORAL | Status: DC | PRN

## 2012-12-03 NOTE — Telephone Encounter (Signed)
OK X1 

## 2012-12-03 NOTE — Telephone Encounter (Signed)
Patient is requesting refill for Ritalin.  Last visit-04/13/2012  Last filled-09/17/2012  UDS-02/05/2012-low risk, contract signed  Please advise. SW

## 2012-12-07 ENCOUNTER — Encounter: Payer: Self-pay | Admitting: *Deleted

## 2012-12-23 ENCOUNTER — Encounter: Payer: Self-pay | Admitting: Internal Medicine

## 2013-02-19 ENCOUNTER — Ambulatory Visit (INDEPENDENT_AMBULATORY_CARE_PROVIDER_SITE_OTHER): Payer: BC Managed Care – PPO | Admitting: Family Medicine

## 2013-02-19 ENCOUNTER — Encounter: Payer: Self-pay | Admitting: Family Medicine

## 2013-02-19 VITALS — BP 124/80 | HR 78 | Temp 98.4°F | Resp 16 | Wt 264.5 lb

## 2013-02-19 DIAGNOSIS — J019 Acute sinusitis, unspecified: Secondary | ICD-10-CM | POA: Insufficient documentation

## 2013-02-19 DIAGNOSIS — J329 Chronic sinusitis, unspecified: Secondary | ICD-10-CM | POA: Insufficient documentation

## 2013-02-19 MED ORDER — SULFAMETHOXAZOLE-TMP DS 800-160 MG PO TABS: 1.0000 | ORAL_TABLET | Freq: Two times a day (BID) | ORAL | Status: DC

## 2013-02-19 NOTE — Progress Notes (Signed)
Pre visit review using our clinic review tool, if applicable. No additional management support is needed unless otherwise documented below in the visit note. 

## 2013-02-19 NOTE — Patient Instructions (Signed)
Follow up as needed Start the Bactrim twice daily for the sinus infection Drink plenty of fluids Mucinex to thin congestion REST! Hang in there!

## 2013-02-19 NOTE — Progress Notes (Signed)
   Subjective:    Patient ID: Andrew Woodard, male    DOB: 12/21/1950, 63 y.o.   MRN: [id number]  HPI URI- sxs started 14 days ago as 'hayfever'.  Took Zyrtec w/ some relief.  Now having facial pain/pressure.  No fevers.  Minimal cough but productive when present.  No ear pain.  + sick contacts.  Allergic to PCN   Review of Systems For ROS see HPI     Objective:   Physical Exam  Constitutional: He appears well-developed and well-nourished. No distress.  HENT:  Head: Normocephalic and atraumatic.  Right Ear: Tympanic membrane normal.  Left Ear: Tympanic membrane normal.  Nose: Mucosal edema and rhinorrhea present. Right sinus exhibits maxillary sinus tenderness and frontal sinus tenderness. Left sinus exhibits no maxillary sinus tenderness and no frontal sinus tenderness.  Mouth/Throat: Mucous membranes are normal. Oropharyngeal exudate and posterior oropharyngeal erythema present. No posterior oropharyngeal edema.  + PND  Eyes: Conjunctivae and EOM are normal. Pupils are equal, round, and reactive to light.  Neck: Normal range of motion. Neck supple.  Cardiovascular: Normal rate, regular rhythm and normal heart sounds.   Pulmonary/Chest: Effort normal and breath sounds normal. No respiratory distress. He has no wheezes.  Lymphadenopathy:    He has no cervical adenopathy.  Skin: Skin is warm and dry.          Assessment & Plan:

## 2013-02-19 NOTE — Assessment & Plan Note (Signed)
New.  Pt's sxs and PE consistent w/ infxn.  Start abx.  mucinex to thin congestion.  Reviewed supportive care and red flags that should prompt return.  Pt expressed understanding and is in agreement w/ plan.

## 2013-02-24 ENCOUNTER — Telehealth: Payer: Self-pay | Admitting: Internal Medicine

## 2013-02-24 NOTE — Telephone Encounter (Signed)
Relevant patient education assigned to patient using Emmi. ° °

## 2013-03-29 ENCOUNTER — Other Ambulatory Visit: Payer: Self-pay | Admitting: Internal Medicine

## 2013-04-13 ENCOUNTER — Telehealth: Payer: Self-pay | Admitting: Internal Medicine

## 2013-04-13 DIAGNOSIS — F988 Other specified behavioral and emotional disorders with onset usually occurring in childhood and adolescence: Secondary | ICD-10-CM

## 2013-04-13 MED ORDER — METHYLPHENIDATE HCL 20 MG PO TABS: 20.0000 mg | ORAL_TABLET | Freq: Two times a day (BID) | ORAL | Status: DC | PRN

## 2013-04-13 NOTE — Telephone Encounter (Signed)
OK X1 

## 2013-04-13 NOTE — Telephone Encounter (Signed)
Rx printed and spoke with the pt and informed him the rx is ready for him to pick-up.  Also informed the pt that he is due a check-up.  Pt understood and agreed.  Will leave the rx up front.//AB/CMA

## 2013-04-13 NOTE — Telephone Encounter (Signed)
Pt came by office requesting prescription for Ritalin 20 mg.  Please advise.

## 2013-05-03 ENCOUNTER — Other Ambulatory Visit: Payer: Self-pay

## 2013-05-03 DIAGNOSIS — J302 Other seasonal allergic rhinitis: Secondary | ICD-10-CM

## 2013-05-03 MED ORDER — FLUTICASONE PROPIONATE 50 MCG/ACT NA SUSP: 1.0000 | Freq: Two times a day (BID) | NASAL | Status: DC | PRN

## 2013-07-30 ENCOUNTER — Other Ambulatory Visit: Payer: Self-pay | Admitting: Internal Medicine

## 2013-08-04 ENCOUNTER — Encounter: Payer: Self-pay | Admitting: Internal Medicine

## 2013-08-04 ENCOUNTER — Ambulatory Visit (INDEPENDENT_AMBULATORY_CARE_PROVIDER_SITE_OTHER)
Admission: RE | Admit: 2013-08-04 | Discharge: 2013-08-04 | Disposition: A | Discharge status: Home / Self Care | Payer: BC Managed Care – PPO | Source: Ambulatory Visit | Attending: Internal Medicine | Admitting: Internal Medicine

## 2013-08-04 ENCOUNTER — Other Ambulatory Visit (INDEPENDENT_AMBULATORY_CARE_PROVIDER_SITE_OTHER): Payer: BC Managed Care – PPO

## 2013-08-04 ENCOUNTER — Ambulatory Visit (INDEPENDENT_AMBULATORY_CARE_PROVIDER_SITE_OTHER): Payer: BC Managed Care – PPO | Admitting: Internal Medicine

## 2013-08-04 VITALS — BP 118/78 | HR 67 | Temp 98.2°F | Ht 70.5 in | Wt 259.2 lb

## 2013-08-04 DIAGNOSIS — Z0000 Encounter for general adult medical examination without abnormal findings: Secondary | ICD-10-CM

## 2013-08-04 DIAGNOSIS — E782 Mixed hyperlipidemia: Secondary | ICD-10-CM

## 2013-08-04 LAB — BASIC METABOLIC PANEL
BUN: 21 mg/dL (ref 6–23)
CHLORIDE: 98 meq/L (ref 96–112)
CO2: 24 meq/L (ref 19–32)
CREATININE: 1.1 mg/dL (ref 0.4–1.5)
Calcium: 9.5 mg/dL (ref 8.4–10.5)
GFR: 74.69 mL/min (ref >60.00)
GLUCOSE: 112 mg/dL — AB (ref 70–99)
Potassium: 3.9 mEq/L (ref 3.5–5.1)
Sodium: 134 mEq/L — ABNORMAL LOW (ref 135–145)

## 2013-08-04 LAB — HEPATIC FUNCTION PANEL
ALT: 39 U/L (ref 0–53)
AST: 28 U/L (ref 0–37)
Albumin: 4.4 g/dL (ref 3.5–5.2)
Alkaline Phosphatase: 55 U/L (ref 39–117)
BILIRUBIN DIRECT: 0.2 mg/dL (ref 0.0–0.3)
BILIRUBIN TOTAL: 1 mg/dL (ref 0.2–1.2)
Total Protein: 7.6 g/dL (ref 6.0–8.3)

## 2013-08-04 LAB — CBC WITH DIFFERENTIAL/PLATELET
BASOS ABS: 0.1 10*3/uL (ref 0.0–0.1)
Basophils Relative: 0.8 % (ref 0.0–3.0)
EOS ABS: 0.1 10*3/uL (ref 0.0–0.7)
Eosinophils Relative: 1 % (ref 0.0–5.0)
HEMATOCRIT: 50.7 % (ref 39.0–52.0)
Hemoglobin: 17.6 g/dL — ABNORMAL HIGH (ref 13.0–17.0)
LYMPHS ABS: 3 10*3/uL (ref 0.7–4.0)
Lymphocytes Relative: 28.4 % (ref 12.0–46.0)
MCHC: 34.8 g/dL (ref 30.0–36.0)
MCV: 104.3 fl — AB (ref 78.0–100.0)
MONO ABS: 0.9 10*3/uL (ref 0.1–1.0)
Monocytes Relative: 8.2 % (ref 3.0–12.0)
NEUTROS PCT: 61.6 % (ref 43.0–77.0)
Neutro Abs: 6.5 10*3/uL (ref 1.4–7.7)
PLATELETS: 198 10*3/uL (ref 150.0–400.0)
RBC: 4.86 Mil/uL (ref 4.22–5.81)
RDW: 13.8 % (ref 11.5–15.5)
WBC: 10.5 10*3/uL (ref 4.0–10.5)

## 2013-08-04 LAB — LIPID PANEL
CHOL/HDL RATIO: 4
CHOLESTEROL: 196 mg/dL (ref 0–200)
HDL: 46.3 mg/dL (ref >39.00)
LDL CALC: 105 mg/dL — AB (ref 0–99)
NonHDL: 149.7
TRIGLYCERIDES: 223 mg/dL — AB (ref 0.0–149.0)
VLDL: 44.6 mg/dL — ABNORMAL HIGH (ref 0.0–40.0)

## 2013-08-04 LAB — TSH: TSH: 0.44 u[IU]/mL (ref 0.35–4.50)

## 2013-08-04 NOTE — Progress Notes (Signed)
   Subjective:    Patient ID: Andrew Woodard, male    DOB: [birth year], 63 y.o.   MRN: [id number]  HPI  He is here for a physical;acute issues denied.   Blood pressure average : < 140/90 Compliant with anti hypertemsive medication. No lightheadedness or other adverse medication effect described.  A heart healthy /low salt diet is followed. Exercise encompasses  60 minutes 7  times per week as walking & golf  without symptoms.  Family history is neg for CVA   Review of Systems  Significant headaches, epistaxis, chest pain, palpitations, exertional dyspnea, claudication, paroxysmal nocturnal dyspnea, or edema absent.        Objective:   Physical Exam Gen.: Healthy and well-nourished in appearance. Alert, appropriate and cooperative throughout exam. Appears younger than stated age  Head: Normocephalic without obvious abnormalities; no alopecia  Eyes: No corneal or conjunctival inflammation noted. Pupils equal round reactive to light and accommodation. Extraocular motion intact.  Ears: External  ear exam reveals no significant lesions or deformities. Canals clear .TMs normal. Hearing is grossly normal bilaterally. Nose: External nasal exam reveals no deformity or inflammation. Nasal mucosa are pink and moist. No lesions or exudates noted.   Mouth: Oral mucosa and oropharynx reveal no lesions or exudates. Teeth in good repair. Neck: No deformities, masses, or tenderness noted. Range of motion  & Thyroid normal. Lungs: Normal respiratory effort; chest expands symmetrically. Minor rhonchi ;no increased work of breathing. Heart: Normal rate and rhythm. Normal S1 and S2. No gallop, click, or rub. S4 w/o murmur. Abdomen: Bowel sounds normal; abdomen soft and nontender. No masses, organomegaly or hernias noted. Genitalia: Genitalia normal except for left varices. Prostate is normal without enlargement, asymmetry, nodularity, or induration                                     Musculoskeletal/extremities: No deformity or scoliosis noted of  the thoracic or lumbar spine.  No clubbing, cyanosis, edema, or significant extremity  deformity noted. Range of motion normal .Tone & strength normal. Crepitus knees. Hand joints normal Fingernail health good. Able to lie down & sit up w/o help. Negative SLR bilaterally Vascular: Carotid, radial artery, dorsalis pedis and  posterior tibial pulses are full and equal. No bruits present. Neurologic: Alert and oriented x3. Deep tendon reflexes symmetrical and normal.  Gait normal.       Skin: Intact without suspicious lesions or rashes. Lymph: No cervical, axillary, or inguinal lymphadenopathy present. Psych: Mood and affect are normal. Normally interactive                                                                                        Assessment & Plan:  #1 comprehensive physical exam; no acute findings  Plan: see Orders  & Recommendations

## 2013-08-04 NOTE — Patient Instructions (Signed)
Your next office appointment will be determined based upon review of your pending labs . Those instructions will be transmitted to you through My Chart  OR  by mail;whichever process is your choice to receive results & recommendations . 

## 2013-08-04 NOTE — Progress Notes (Signed)
Pre visit review using our clinic review tool, if applicable. No additional management support is needed unless otherwise documented below in the visit note. 

## 2013-08-08 ENCOUNTER — Telehealth: Payer: Self-pay | Admitting: Internal Medicine

## 2013-08-09 ENCOUNTER — Other Ambulatory Visit: Payer: Self-pay

## 2013-08-09 ENCOUNTER — Ambulatory Visit: Payer: BC Managed Care – PPO

## 2013-08-09 ENCOUNTER — Telehealth: Payer: Self-pay

## 2013-08-09 DIAGNOSIS — R7309 Other abnormal glucose: Secondary | ICD-10-CM

## 2013-08-09 LAB — HEMOGLOBIN A1C: HEMOGLOBIN A1C: 5.6 % (ref 4.6–6.5)

## 2013-08-09 MED ORDER — LISINOPRIL 40 MG PO TABS: ORAL_TABLET | ORAL | Status: DC

## 2013-08-09 MED ORDER — HYDROCHLOROTHIAZIDE 25 MG PO TABS: ORAL_TABLET | ORAL | Status: DC

## 2013-08-09 MED ORDER — METOPROLOL SUCCINATE ER 50 MG PO TB24: ORAL_TABLET | ORAL | Status: DC

## 2013-08-09 NOTE — Telephone Encounter (Signed)
Request for add on has been faxed to lab 

## 2013-08-09 NOTE — Telephone Encounter (Signed)
Message copied by Shelly Coss on Mon Aug 09, 2013  9:43 AM ------      Message from: Hendricks Limes      Created: Mon Aug 09, 2013  6:50 AM       Please add A1c (790.29)       ------

## 2013-08-10 ENCOUNTER — Other Ambulatory Visit: Payer: Self-pay

## 2013-08-10 ENCOUNTER — Encounter: Payer: Self-pay | Admitting: Internal Medicine

## 2013-08-10 MED ORDER — METOPROLOL SUCCINATE ER 50 MG PO TB24: ORAL_TABLET | ORAL | Status: DC

## 2013-08-10 MED ORDER — LISINOPRIL 40 MG PO TABS: ORAL_TABLET | ORAL | Status: DC

## 2013-08-10 MED ORDER — SERTRALINE HCL 100 MG PO TABS: ORAL_TABLET | ORAL | Status: DC

## 2013-08-10 NOTE — Telephone Encounter (Signed)
This has already been done this morning per mychart patient sent

## 2013-08-10 NOTE — Telephone Encounter (Signed)
OK X1 with 1 refill

## 2013-08-10 NOTE — Telephone Encounter (Signed)
Patient is requesting a 10 day supply script of lisinopril and toprol to get him through until his mail order comes in.  [facility] [address]

## 2014-03-04 ENCOUNTER — Other Ambulatory Visit: Payer: Self-pay | Admitting: Internal Medicine

## 2014-03-04 ENCOUNTER — Telehealth: Payer: Self-pay

## 2014-03-04 NOTE — Telephone Encounter (Signed)
-----   Message from Hendricks Limes, MD sent at 03/04/2014 11:41 AM EST ----- Med refill X 3 mos OK

## 2014-03-04 NOTE — Telephone Encounter (Addendum)
Nothing I need to do. Med has already been refilled by Dr Linna Darner and electronically sent to the pharmacy for Zoloft

## 2014-05-17 ENCOUNTER — Other Ambulatory Visit: Payer: Self-pay | Admitting: Internal Medicine

## 2014-05-17 NOTE — Telephone Encounter (Signed)
OK but should be 1 qd prn only  GI & cardiac risks with chronic use

## 2014-07-01 ENCOUNTER — Other Ambulatory Visit: Payer: Self-pay | Admitting: Emergency Medicine

## 2014-07-04 ENCOUNTER — Other Ambulatory Visit: Payer: Self-pay | Admitting: Emergency Medicine

## 2014-07-04 ENCOUNTER — Telehealth: Payer: Self-pay | Admitting: Internal Medicine

## 2014-07-04 MED ORDER — SERTRALINE HCL 100 MG PO TABS: 100.0000 mg | ORAL_TABLET | Freq: Every day | ORAL | Status: DC

## 2014-07-04 NOTE — Telephone Encounter (Signed)
LVM for pt stating requested RX was sent to local pharm as well as mail service for Zoloft.

## 2014-07-04 NOTE — Telephone Encounter (Signed)
Patient needs a refill for sertraline (ZOLOFT) 100 MG tablet [[id number]. His mail order pharmacy hasn't been able to contact us for request so he only has 2 left. Can you please [facility] [address] so he will have some before mail order gets there. We did schedule for a physical in July

## 2014-07-04 NOTE — Telephone Encounter (Signed)
Patient stated he got a call from you.

## 2014-08-09 ENCOUNTER — Encounter: Payer: Self-pay | Admitting: Internal Medicine

## 2014-08-09 ENCOUNTER — Ambulatory Visit (INDEPENDENT_AMBULATORY_CARE_PROVIDER_SITE_OTHER): Payer: BLUE CROSS/BLUE SHIELD | Admitting: Internal Medicine

## 2014-08-09 VITALS — BP 138/86 | HR 73 | Temp 97.8°F | Resp 18 | Ht 70.0 in | Wt 246.0 lb

## 2014-08-09 DIAGNOSIS — Z0000 Encounter for general adult medical examination without abnormal findings: Secondary | ICD-10-CM

## 2014-08-09 NOTE — Progress Notes (Signed)
   Subjective:    Patient ID: Andrew Woodard, male    DOB: Dec 24, 1950, 64 y.o.   MRN: [id number]  HPI He is here for a physical;acute issues include recurrent wax buildup in ears.  He's on a modified heart healthy diet. He walks at least an hour a day 5 days a week without associated cardiopulmonary symptoms. With diet and exercise he's lost 26 pounds. At home his blood pressure is 113/72. He's upset today as he received news that his car needs very extensive repairs.  Colonoscopy was completed 2014 and was negative. He has no active GI symptoms  He is actually asymptomatic except for the wax issues and nocturia once nightly.  Review of Systems  Chest pain, palpitations, tachycardia, exertional dyspnea, paroxysmal nocturnal dyspnea, claudication or edema are absent. No unexplained weight loss, abdominal pain, significant dyspepsia, dysphagia, melena, rectal bleeding, or persistently small caliber stools. Dysuria, pyuria, hematuria, frequency, or polyuria are denied. Change in hair, skin, nails denied. No bowel changes of constipation or diarrhea. No intolerance to heat or cold.     Objective:   Physical Exam Pertinent or positive findings include: Right tympanic membrane is dull. He does have some wax in the right otic canal. There's marked erythema of the nares. He also has erythema of the oropharynx. There is marked crepitus of the knees. Slight varices are noted on scrotal exam. Genitourinary and prostate exams are otherwise normal.   General appearance :adequately nourished; in no distress.  Eyes: No conjunctival inflammation or scleral icterus is present.  Oral exam:  Lips and gums are healthy appearing.There is no oropharyngeal  exudate noted. Dental hygiene is good.  Heart:  Normal rate and regular rhythm. S1 and S2 normal without gallop, murmur, click, rub or other extra sounds    Lungs:Chest clear to auscultation; no wheezes, rhonchi,rales ,or rubs present.No increased work of  breathing.   Abdomen: bowel sounds normal, soft and non-tender without masses, organomegaly or hernias noted.  No guarding or rebound.  Vascular : all pulses equal ; no bruits present.  Skin:Warm & dry.  Intact without suspicious lesions or rashes ; no tenting or jaundice   Lymphatic: No lymphadenopathy is noted about the head, neck, axilla, or inguinal areas.   Neuro: Strength, tone & DTRs normal.        Assessment & Plan:  #1 comprehensive physical exam; no acute findings  Plan: see Orders  & Recommendations

## 2014-08-09 NOTE — Progress Notes (Signed)
Pre visit review using our clinic review tool, if applicable. No additional management support is needed unless otherwise documented below in the visit note. 

## 2014-08-09 NOTE — Patient Instructions (Addendum)
Minimal Blood Pressure Goal= AVERAGE < 140/90;  Ideal is an AVERAGE < 135/85. This AVERAGE should be calculated from @ least 3-5 BP readings taken @ different times of day on different days of week. You should not respond to isolated BP readings , but rather the AVERAGE for that week .Please bring your  blood pressure cuff to office visits to verify that it is reliable.It  can also be checked against the blood pressure device at the pharmacy. Finger or wrist cuffs are not dependable; an arm cuff is.  Your next office appointment will be determined based upon review of your pending labs .  Those written interpretation of the lab results and instructions will be transmitted to you by My Chart  Critical results will be called.   Followup as needed for any active or acute issue. Please report any significant change in your symptoms.  Please do not use Q-tips as this simply packs the wax down against he eardrum. Should wax build up occur, please put 2-3 drops of mineral oil in the ear at night and cover the canal with a  cotton ball.In the morning fill the canal with hydrogen peroxide & leave  for 10-15 minutes.Following this shower and use the thinnest washrag available to wick out the wax.   Please think about quitting smoking. Review the risks we discussed. Please call 1-800-QUIT-NOW 5192353873) for free smoking cessation counseling. There are multiple options are to help you stop smoking. These include nicotine patches, nicotine gum, and the new "E cigarette".

## 2014-08-26 ENCOUNTER — Other Ambulatory Visit: Payer: Self-pay | Admitting: Internal Medicine

## 2014-10-25 ENCOUNTER — Other Ambulatory Visit: Payer: Self-pay | Admitting: Emergency Medicine

## 2014-10-25 MED ORDER — MELOXICAM 15 MG PO TABS: 15.0000 mg | ORAL_TABLET | Freq: Every day | ORAL | Status: DC | PRN

## 2014-10-25 MED ORDER — SERTRALINE HCL 100 MG PO TABS: ORAL_TABLET | ORAL | Status: DC

## 2014-12-19 ENCOUNTER — Other Ambulatory Visit: Payer: Self-pay | Admitting: Internal Medicine

## 2015-01-02 ENCOUNTER — Encounter: Payer: Self-pay | Admitting: Internal Medicine

## 2015-01-05 ENCOUNTER — Ambulatory Visit (INDEPENDENT_AMBULATORY_CARE_PROVIDER_SITE_OTHER): Payer: BLUE CROSS/BLUE SHIELD | Admitting: Internal Medicine

## 2015-01-05 ENCOUNTER — Encounter: Payer: Self-pay | Admitting: Internal Medicine

## 2015-01-05 VITALS — BP 118/80 | HR 64 | Temp 97.6°F | Ht 70.0 in | Wt 244.0 lb

## 2015-01-05 DIAGNOSIS — Z23 Encounter for immunization: Secondary | ICD-10-CM

## 2015-01-05 DIAGNOSIS — J0110 Acute frontal sinusitis, unspecified: Secondary | ICD-10-CM | POA: Diagnosis not present

## 2015-01-05 MED ORDER — SULFAMETHOXAZOLE-TRIMETHOPRIM 800-160 MG PO TABS: 1.0000 | ORAL_TABLET | Freq: Two times a day (BID) | ORAL | Status: DC

## 2015-01-05 NOTE — Progress Notes (Signed)
   Subjective:    Patient ID: Andrew Woodard, male    DOB: December 09, 1950, 64 y.o.   MRN: [id number]  HPI   His symptoms began 5 days ago as thick brown-green nasal discharge associated with frontal sinus pain and facial pain. He describes discomfort in the left ear. He's also had associated sweats. Cough has been productive of dark sputum as well. He has been using a Neti pot.  Review of Systems  He denies fever or chills. He has no extrinsic symptoms of itchy, watery eyes, sneezing, or wheezing. He has had no otic discharge.    Objective:   Physical Exam   General appearance:Adequately nourished; no acute distress or increased work of breathing is present.    Lymphatic: No  lymphadenopathy about the head, neck, or axilla .  Eyes: No conjunctival inflammation or lid edema is present. There is no scleral icterus.  Ears:  External ear exam shows no significant lesions or deformities.  Otoscopic examination reveals dull minimally erythematous tympanic membranes particularly on the left.  Nose:  External nasal examination shows no deformity or inflammation. Nasal mucosa are markedly erythematous without lesions or exudates No septal dislocation or deviation.No obstruction to airflow.   Oral exam: Dental hygiene is good; lips and gums are healthy appearing.There is no oropharyngeal erythema or exudate .  Neck:  No deformities, thyromegaly, masses, or tenderness noted.   Supple with full range of motion without pain.   Heart:  Normal rate and regular rhythm. S1 and S2 normal without gallop, murmur, click, rub or other extra sounds.   Lungs:Chest clear to auscultation; no wheezes, rhonchi,rales ,or rubs present.  Extremities:  No cyanosis, edema, or clubbing  noted    Skin: Warm & dry w/o tenting or jaundice. No significant lesions or rash.     Assessment & Plan:  #1 rhinosinusitis with bronchitis vs rhinitis induced cough  #2 penicillin allergy  Plan: Nasal hygiene interventions  discussed. See prescription medications

## 2015-01-05 NOTE — Progress Notes (Signed)
Pre visit review using our clinic review tool, if applicable. No additional management support is needed unless otherwise documented below in the visit note. 

## 2015-01-05 NOTE — Patient Instructions (Signed)

## 2015-02-23 ENCOUNTER — Telehealth: Payer: Self-pay | Admitting: Internal Medicine

## 2015-02-23 NOTE — Telephone Encounter (Signed)
Pt has changed insurance companies and he is needing new prescriptions for hydrochlorothiazide (HYDRODIURIL) 25 MG tablet [id number], lisinopril (PRINIVIL,ZESTRIL) 40 MG tablet [id number] and metoprolol succinate (TOPROL-XL) 50 MG 24 hr tablet [id number]   He says he will need to pick these prescriptions up because he needs to take them to different places to get them cheaper

## 2015-02-24 MED ORDER — HYDROCHLOROTHIAZIDE 25 MG PO TABS: ORAL_TABLET | ORAL | Status: DC

## 2015-02-24 MED ORDER — METOPROLOL SUCCINATE ER 50 MG PO TB24: ORAL_TABLET | ORAL | Status: DC

## 2015-02-24 MED ORDER — LISINOPRIL 40 MG PO TABS: ORAL_TABLET | ORAL | Status: DC

## 2015-02-24 NOTE — Telephone Encounter (Signed)
Rxs have been printed. Pt has been notified.

## 2015-03-30 ENCOUNTER — Other Ambulatory Visit: Payer: Self-pay | Admitting: Internal Medicine

## 2015-03-30 MED ORDER — METOPROLOL SUCCINATE ER 50 MG PO TB24: ORAL_TABLET | ORAL | Status: DC

## 2015-03-30 MED ORDER — LISINOPRIL 40 MG PO TABS: ORAL_TABLET | ORAL | Status: DC

## 2015-03-30 NOTE — Addendum Note (Signed)
Addended by: Cresenciano Lick on: 03/30/2015 08:24 AM   Modules accepted: Orders

## 2015-04-06 ENCOUNTER — Encounter: Payer: Self-pay | Admitting: Internal Medicine

## 2015-04-06 ENCOUNTER — Ambulatory Visit (INDEPENDENT_AMBULATORY_CARE_PROVIDER_SITE_OTHER): Payer: PRIVATE HEALTH INSURANCE | Admitting: Internal Medicine

## 2015-04-06 VITALS — BP 120/84 | HR 62 | Temp 98.3°F | Resp 16 | Wt 244.0 lb

## 2015-04-06 DIAGNOSIS — Z720 Tobacco use: Secondary | ICD-10-CM

## 2015-04-06 DIAGNOSIS — I10 Essential (primary) hypertension: Secondary | ICD-10-CM

## 2015-04-06 DIAGNOSIS — E782 Mixed hyperlipidemia: Secondary | ICD-10-CM | POA: Diagnosis not present

## 2015-04-06 DIAGNOSIS — F17200 Nicotine dependence, unspecified, uncomplicated: Secondary | ICD-10-CM

## 2015-04-06 DIAGNOSIS — F329 Major depressive disorder, single episode, unspecified: Secondary | ICD-10-CM | POA: Diagnosis not present

## 2015-04-06 DIAGNOSIS — F32A Depression, unspecified: Secondary | ICD-10-CM

## 2015-04-06 MED ORDER — SERTRALINE HCL 100 MG PO TABS: ORAL_TABLET | ORAL | Status: DC

## 2015-04-06 MED ORDER — LISINOPRIL 40 MG PO TABS: ORAL_TABLET | ORAL | Status: DC

## 2015-04-06 MED ORDER — HYDROCHLOROTHIAZIDE 25 MG PO TABS: ORAL_TABLET | ORAL | Status: DC

## 2015-04-06 MED ORDER — METOPROLOL SUCCINATE ER 50 MG PO TB24: ORAL_TABLET | ORAL | Status: DC

## 2015-04-06 MED ORDER — FLUOCINOLONE ACETONIDE 0.01 % EX CREA: TOPICAL_CREAM | Freq: Two times a day (BID) | CUTANEOUS | Status: DC

## 2015-04-06 NOTE — Progress Notes (Signed)
Subjective:    Patient ID: Andrew Woodard, male    DOB: July 09, 1950, 65 y.o.   MRN: [id number]  HPI He is here to establish with a new pcp.     Hypertension: He is taking his medication daily. He is compliant with a low sodium diet.  He denies chest pain, palpitations, edema, shortness of breath and regular headaches. He is exercising regularly.  He does monitor his blood pressure, 106/65 at [facility], .    Back pain:  He has several discs that have ruptured in his back and has arthritis.  He has daily arthritis pain and several flares of back pain.  He takes mobic daily. He takes flexeril and norco for the flares of back.  The last time he used the norco was 2 years ago.  He uses the flexeril more often, but only as needed.    Smoker:  He is still smoking but working on quitting.  He wants to quit and knows he needs to quit.  Depression: He is taking his medication daily as prescribed. He denies any side effects from the medication. He feels his depression is well controlled and he is happy with his current dose of medication.   He often drinks a bottle of wine a day, sometimes more.    Medications and allergies reviewed with patient and updated if appropriate.  Patient Active Problem List   Diagnosis Date Noted  . Nonspecific abnormal electrocardiogram (ECG) (EKG) 04/13/2012  . LOW BACK PAIN SYNDROME 10/09/2009  . COLONIC POLYPS, HX OF 06/07/2008  . LIPOMAS, MULTIPLE 01/26/2008  . HYPERLIPIDEMIA 01/01/2007  . DYSMETABOLIC SYNDROME X 123456  . Smoker 01/01/2007  . Essential hypertension 01/01/2007  . ADD 06/11/2006    Current Outpatient Prescriptions on File Prior to Visit  Medication Sig Dispense Refill  . cyclobenzaprine (FLEXERIL) 10 MG tablet Take 10 mg by mouth as needed for muscle spasms (RX'ed  by Dr.Kaffrey Charlie Pitter)).    . fluticasone (FLONASE) 50 MCG/ACT nasal spray Place 1 spray into both nostrils 2 (two) times daily as needed for rhinitis. 16 g 0  . hydrochlorothiazide  (HYDRODIURIL) 25 MG tablet TAKE 1/2 BY MOUTH DAILY 45 tablet 0  . HYDROcodone-acetaminophen (NORCO) 10-325 MG per tablet Take 1 tablet by mouth every 6 (six) hours as needed for pain (Rx'ed by Charlie Pitter).    Marland Kitchen lisinopril (PRINIVIL,ZESTRIL) 40 MG tablet TAKE 1 BY MOUTH DAILY 30 tablet 0  . meloxicam (MOBIC) 15 MG tablet TAKE 1 BY MOUTH DAILY AS NEEDED 90 tablet 0  . metoprolol succinate (TOPROL-XL) 50 MG 24 hr tablet TAKE 1 BY MOUTH DAILY 30 tablet 0  . sertraline (ZOLOFT) 100 MG tablet TAKE 1 (100 MG TOTAL) BY MOUTH DAILY 90 tablet 0   No current facility-administered medications on file prior to visit.    Past Medical History  Diagnosis Date  . ADD (attention deficit disorder)   . Fasting hyperglycemia   . Hypertension   . Allergy   . Arthritis   . Depression   . Hyperlipidemia     Past Surgical History  Procedure Laterality Date  . Pilonidal cyst excision      X 2  . Vasectomy      X 2; Dr Karsten Ro  . Ganglionectomy    . Abscess drainage  2012    L thigh, Dr Excell Seltzer (not MRSA)  . Cystectomy  2011    L knee, Dr French Ana  . Colonoscopy with polypectomy  2007    Dr Henrene Pastor  .  Esi  2013     Dr Ernestina Patches  . Colonoscopy  2014    Social History   Social History  . Marital Status: Married    Spouse Name: N/A  . Number of Children: N/A  . Years of Education: N/A   Social History Main Topics  . Smoking status: Current Every Day Smoker -- 1.50 packs/day    Types: Cigarettes  . Smokeless tobacco: Never Used     Comment: smokedage18- present , up to 2 ppd for 10 years. 08/09/14 6-7 cigarettes per day.  . Alcohol Use: Yes     Comment: 28-32  glasses/ week of wine  . Drug Use: No  . Sexual Activity: Not on file   Other Topics Concern  . Not on file   Social History Narrative    Family History  Problem Relation Age of Onset  . Alzheimer's disease Mother   . Depression Father   . Kidney disease Father     ESRD  . Colon cancer Maternal Grandmother 94  . Heart attack  Paternal Grandfather 76  . Heart attack Maternal Grandfather 76     pacer  . Stroke Neg Hx   . Diabetes Neg Hx   . Esophageal cancer Neg Hx   . Rectal cancer Neg Hx   . Stomach cancer Neg Hx     Review of Systems  Constitutional: Negative for fever, appetite change, fatigue and unexpected weight change (intentional weight loss).  Respiratory: Negative for cough, shortness of breath and wheezing.   Cardiovascular: Negative for chest pain, palpitations and leg swelling.  Neurological: Negative for dizziness, light-headedness and headaches.       Objective:   Filed Vitals:   04/06/15 0842  BP: 120/84  Pulse: 62  Temp: 98.3 F (36.8 C)  Resp: 16   Filed Weights   04/06/15 0842  Weight: 244 lb (110.678 kg)   Body mass index is 35.01 kg/(m^2).   Physical Exam Constitutional: Appears well-developed and well-nourished. No distress.  Neck: Neck supple. No tracheal deviation present. No thyromegaly present.  No carotid bruit. No cervical adenopathy.   Cardiovascular: Normal rate, regular rhythm and normal heart sounds.   No murmur heard.  No edema Pulmonary/Chest: Effort normal and breath sounds normal. No respiratory distress. No wheezes.       Assessment & Plan:   Stressed smoking cessation, advised decreasing alcohol intake  See Problem List for Assessment and Plan of chronic medical problems.

## 2015-04-06 NOTE — Assessment & Plan Note (Signed)
Check lipid panel  Not on a statin Continue regular exercise Working on weight loss

## 2015-04-06 NOTE — Assessment & Plan Note (Addendum)
BP well  He exercises and has lost weight Current regimen effective and well tolerated, but he wants to try to come off medication if possible He will try holding the hctz 12.5 mg and if no swelling in leg and BP controlled will discontinue.  If he has swelling he can try decreasing lisinopril to 20 mg daily He knows he needs to monitor his bp closely

## 2015-04-06 NOTE — Progress Notes (Signed)
Pre visit review using our clinic review tool, if applicable. No additional management support is needed unless otherwise documented below in the visit note. 

## 2015-04-06 NOTE — Assessment & Plan Note (Signed)
Stressed smoking cessation 

## 2015-04-06 NOTE — Patient Instructions (Signed)
   Test(s) ordered today. Your results will be released to [facility] (or called to you) after review, usually within 72hours after test completion. If any changes need to be made, you will be notified at that same time.  All other Health Maintenance issues reviewed.   All recommended immunizations and age-appropriate screenings are up-to-date/discussed.   Medications reviewed and updated.  Changes include - you can try decreasing the sertraline to 50 mg daily and see how you feel.  You can also try holding the hydrochlorothiazide and monitor your blood pressure.  If it is well controlled we can discontinue it.  If you develop leg swelling, restart the hydrochlorothiazide and cut the lisinopril in half.  Just monitor your blood pressure.     Please followup annually

## 2015-04-06 NOTE — Assessment & Plan Note (Signed)
stable, controlled He may try to decrease to 50 mg to see how much he really need it Continue sertraline

## 2015-04-26 ENCOUNTER — Other Ambulatory Visit: Payer: Self-pay | Admitting: Internal Medicine

## 2015-05-16 ENCOUNTER — Other Ambulatory Visit: Payer: Self-pay | Admitting: Internal Medicine

## 2015-05-16 MED ORDER — MELOXICAM 15 MG PO TABS: 15.0000 mg | ORAL_TABLET | Freq: Every day | ORAL | Status: DC

## 2015-05-16 NOTE — Telephone Encounter (Signed)
rx sent to [facility] - call him - he needs to get his labs done as this med (mobic) can affect his kidneys

## 2015-05-16 NOTE — Addendum Note (Signed)
Addended by: Binnie Rail on: 05/16/2015 08:09 AM   Modules accepted: Orders

## 2015-05-16 NOTE — Telephone Encounter (Signed)
Please advise can this be refilled

## 2015-05-16 NOTE — Telephone Encounter (Signed)
Spoke with pt to inform.  

## 2015-07-29 ENCOUNTER — Other Ambulatory Visit: Payer: Self-pay | Admitting: Internal Medicine

## 2015-08-22 ENCOUNTER — Other Ambulatory Visit: Payer: Self-pay | Admitting: Internal Medicine

## 2015-08-22 MED ORDER — MELOXICAM 15 MG PO TABS: 15.0000 mg | ORAL_TABLET | Freq: Every day | ORAL | 0 refills | Status: DC

## 2015-11-15 ENCOUNTER — Other Ambulatory Visit: Payer: Self-pay | Admitting: Internal Medicine

## 2015-11-15 MED ORDER — MELOXICAM 15 MG PO TABS: 15.0000 mg | ORAL_TABLET | Freq: Every day | ORAL | 1 refills | Status: DC

## 2016-04-25 NOTE — Progress Notes (Signed)
Subjective:    Patient ID: Andrew Woodard, male    DOB: 09-30-1950, 66 y.o.   MRN: [id number]  HPI The patient is here for follow up.  Hypertension: He is taking his medication daily. He is compliant with a low sodium diet.  He denies chest pain, palpitations, edema, shortness of breath and regular headaches. He is exercising regularly.  He does monitor his blood pressure at home and it is usually less than 120/75.    Hyperlipidemia: He is not on any medication. He is compliant with a low fat/cholesterol diet. He is exercising regularly - walking an playing golf and walking    Depression: He is taking his medication daily as prescribed. He denies any side effects from the medication. He feels his depression is well controlled and he is happy with his current dose of medication.   Smoking, alcohol abuse:  He is still smoking and knows he should quit.  He drinks at least one bottle of wine a day.   His let ear gets stopped up:  He uses mineral oil and hydrogen peroxide.  He denies pain, pressure or change in his hearing.  He does neti pot when he gets congested.   Chronic pain: He takes meloxicam most days for back pain and arthritis.  He has occasional flares of his back pain and occasionally will need hydrocodone or flexeril.    Medications and allergies reviewed with patient and updated if appropriate.  Patient Active Problem List   Diagnosis Date Noted  . Depression 04/06/2015  . Nonspecific abnormal electrocardiogram (ECG) (EKG) 04/13/2012  . LOW BACK PAIN SYNDROME 10/09/2009  . COLONIC POLYPS, HX OF 06/07/2008  . LIPOMAS, MULTIPLE 01/26/2008  . HYPERLIPIDEMIA 01/01/2007  . DYSMETABOLIC SYNDROME X 86/76/7209  . Smoker 01/01/2007  . Essential hypertension 01/01/2007  . ADD 06/11/2006    Current Outpatient Prescriptions on File Prior to Visit  Medication Sig Dispense Refill  . fluocinolone (VANOS) 0.01 % cream Apply topically 2 (two) times daily. 60 g 1  . fluticasone  (FLONASE) 50 MCG/ACT nasal spray Place 1 spray into both nostrils 2 (two) times daily as needed for rhinitis. 16 g 0  . hydrochlorothiazide (HYDRODIURIL) 25 MG tablet TAKE 1/2 BY MOUTH DAILY 45 tablet 3  . lisinopril (PRINIVIL,ZESTRIL) 40 MG tablet TAKE 1 TABLET EVERY DAY 90 tablet 3  . meloxicam (MOBIC) 15 MG tablet Take 1 tablet (15 mg total) by mouth daily. 90 tablet 1  . metoprolol succinate (TOPROL-XL) 50 MG 24 hr tablet TAKE 1 TABLET EVERY DAY 90 tablet 3  . sertraline (ZOLOFT) 100 MG tablet TAKE 1 (100 MG TOTAL) BY MOUTH DAILY 90 tablet 3   No current facility-administered medications on file prior to visit.     Past Medical History:  Diagnosis Date  . ADD (attention deficit disorder)   . Allergy   . Arthritis   . Depression   . Fasting hyperglycemia   . Hyperlipidemia   . Hypertension     Past Surgical History:  Procedure Laterality Date  . ABSCESS DRAINAGE  2012   L thigh, Dr Excell Seltzer (not MRSA)  . COLONOSCOPY  2014  . colonoscopy with polypectomy  2007   Dr Henrene Pastor  . CYSTECTOMY  2011   L knee, Dr French Ana  . ESI  2013    Dr Ernestina Patches  . ganglionectomy    . PILONIDAL CYST EXCISION     X 2  . VASECTOMY     X 2; Dr Karsten Ro  Social History   Social History  . Marital status: Married    Spouse name: N/A  . Number of children: N/A  . Years of education: N/A   Social History Main Topics  . Smoking status: Current Every Day Smoker    Packs/day: 1.50    Types: Cigarettes  . Smokeless tobacco: Never Used     Comment: smokedage18- present , up to 2 ppd for 10 years. 08/09/14 6-7 cigarettes per day.  . Alcohol use Yes     Comment: 28-32  glasses/ week of wine  . Drug use: No  . Sexual activity: Not on file   Other Topics Concern  . Not on file   Social History Narrative  . No narrative on file    Family History  Problem Relation Age of Onset  . Alzheimer's disease Mother   . Depression Father   . Kidney disease Father     ESRD  . Colon cancer Maternal  Grandmother 59  . Heart attack Paternal Grandfather 76  . Heart attack Maternal Grandfather 76     pacer  . Stroke Neg Hx   . Diabetes Neg Hx   . Esophageal cancer Neg Hx   . Rectal cancer Neg Hx   . Stomach cancer Neg Hx     Review of Systems  Constitutional: Negative for chills and fever.  HENT: Positive for congestion and postnasal drip.   Respiratory: Positive for cough (pnd) and wheezing (mild). Negative for shortness of breath.   Cardiovascular: Negative for chest pain, palpitations and leg swelling.  Gastrointestinal: Negative for abdominal pain.  Neurological: Positive for dizziness (mild, rare, transient when standing up quikcly). Negative for headaches.       Objective:   Vitals:   04/26/16 0951  BP: (!) 144/86  Pulse: 62  Resp: 16  Temp: 98.1 F (36.7 C)   Wt Readings from Last 3 Encounters:  04/26/16 250 lb (113.4 kg)  04/06/15 244 lb (110.7 kg)  01/05/15 244 lb (110.7 kg)   Body mass index is 35.87 kg/m.   Physical Exam    Constitutional: Appears well-developed and well-nourished. No distress.  HENT:  Head: Normocephalic and atraumatic.  Neck: Neck supple. No tracheal deviation present. No thyromegaly present.  No cervical lymphadenopathy Cardiovascular: Normal rate, regular rhythm and normal heart sounds.   No murmur heard. No carotid bruit .  No edema Pulmonary/Chest: Effort normal and breath sounds normal. No respiratory distress. No has no wheezes. No rales.  Skin: Skin is warm and dry. Not diaphoretic.  Psychiatric: Normal mood and affect. Behavior is normal.      Assessment & Plan:    See Problem List for Assessment and Plan of chronic medical problems.   FU annually, sooner if needed

## 2016-04-26 ENCOUNTER — Encounter: Payer: Self-pay | Admitting: Internal Medicine

## 2016-04-26 ENCOUNTER — Ambulatory Visit (INDEPENDENT_AMBULATORY_CARE_PROVIDER_SITE_OTHER): Payer: No Typology Code available for payment source | Admitting: Internal Medicine

## 2016-04-26 VITALS — BP 144/86 | HR 62 | Temp 98.1°F | Resp 16 | Ht 70.0 in | Wt 250.0 lb

## 2016-04-26 DIAGNOSIS — F3289 Other specified depressive episodes: Secondary | ICD-10-CM

## 2016-04-26 DIAGNOSIS — F17200 Nicotine dependence, unspecified, uncomplicated: Secondary | ICD-10-CM | POA: Diagnosis not present

## 2016-04-26 DIAGNOSIS — E782 Mixed hyperlipidemia: Secondary | ICD-10-CM

## 2016-04-26 DIAGNOSIS — Z125 Encounter for screening for malignant neoplasm of prostate: Secondary | ICD-10-CM

## 2016-04-26 DIAGNOSIS — I10 Essential (primary) hypertension: Secondary | ICD-10-CM

## 2016-04-26 DIAGNOSIS — G8929 Other chronic pain: Secondary | ICD-10-CM

## 2016-04-26 DIAGNOSIS — M545 Low back pain: Secondary | ICD-10-CM

## 2016-04-26 DIAGNOSIS — F1010 Alcohol abuse, uncomplicated: Secondary | ICD-10-CM

## 2016-04-26 MED ORDER — SERTRALINE HCL 100 MG PO TABS: ORAL_TABLET | ORAL | 3 refills | Status: DC

## 2016-04-26 MED ORDER — LISINOPRIL 40 MG PO TABS: 40.0000 mg | ORAL_TABLET | Freq: Every day | ORAL | 3 refills | Status: DC

## 2016-04-26 MED ORDER — HYDROCHLOROTHIAZIDE 25 MG PO TABS: ORAL_TABLET | ORAL | 3 refills | Status: DC

## 2016-04-26 MED ORDER — METOPROLOL SUCCINATE ER 50 MG PO TB24: 50.0000 mg | ORAL_TABLET | Freq: Every day | ORAL | 3 refills | Status: DC

## 2016-04-26 MED ORDER — MELOXICAM 15 MG PO TABS: 15.0000 mg | ORAL_TABLET | Freq: Every day | ORAL | 1 refills | Status: DC

## 2016-04-26 NOTE — Patient Instructions (Addendum)
  Test(s) ordered today. Your results will be released to MyChart (or called to you) after review, usually within 72hours after test completion. If any changes need to be made, you will be notified at that same time.  All other Health Maintenance issues reviewed.   All recommended immunizations and age-appropriate screenings are up-to-date or discussed.  No immunizations administered today.   Medications reviewed and updated.  No changes recommended at this time.  Your prescription(s) have been submitted to your pharmacy. Please take as directed and contact our office if you believe you are having problem(s) with the medication(s).   Please followup in one year   

## 2016-04-26 NOTE — Assessment & Plan Note (Signed)
Controlled, stable Continue current dose of medication  

## 2016-04-26 NOTE — Assessment & Plan Note (Addendum)
At home 115/72  BP well controlled at home Current regimen effective and well tolerated Continue current medications at current doses Check labs

## 2016-04-26 NOTE — Assessment & Plan Note (Signed)
Stressed smoking cessation Did not tolerate wellbutrin Will consider chantix and let me know if he wants to try it

## 2016-04-26 NOTE — Assessment & Plan Note (Signed)
Takes meloxicam daily Uses hydrocodone and flexeril rarely for flares

## 2016-04-26 NOTE — Assessment & Plan Note (Signed)
Check lipid panel, tsh, cmp

## 2016-04-26 NOTE — Assessment & Plan Note (Signed)
Drinking at least one bottle a day Stressed cutting back on alcohol use

## 2016-04-26 NOTE — Progress Notes (Signed)
Pre visit review using our clinic review tool, if applicable. No additional management support is needed unless otherwise documented below in the visit note. 

## 2016-04-30 ENCOUNTER — Ambulatory Visit: Payer: PRIVATE HEALTH INSURANCE | Admitting: Internal Medicine

## 2016-11-22 ENCOUNTER — Other Ambulatory Visit: Payer: Self-pay | Admitting: Internal Medicine

## 2017-02-21 ENCOUNTER — Other Ambulatory Visit: Payer: Self-pay | Admitting: Internal Medicine

## 2017-04-08 NOTE — Progress Notes (Signed)
Subjective:    Patient ID: Andrew Woodard, male    DOB: Feb 25, 1950, 67 y.o.   MRN: [id number]  HPI He is here for a physical exam.   His son is in rehab for substance abuse and that has created increased stress.  Him and his wife care for his twin grandkids every other week.  He himself smokes and drinks excessive alcohol and he knows that is a problem.   Medications and allergies reviewed with patient and updated if appropriate.  Patient Active Problem List   Diagnosis Date Noted  . Hyperglycemia 04/09/2017  . Alcohol abuse 04/26/2016  . Depression 04/06/2015  . Nonspecific abnormal electrocardiogram (ECG) (EKG) 04/13/2012  . LOW BACK PAIN SYNDROME 10/09/2009  . COLONIC POLYPS, HX OF 06/07/2008  . LIPOMAS, MULTIPLE 01/26/2008  . HYPERLIPIDEMIA 01/01/2007  . DYSMETABOLIC SYNDROME X 12/45/8099  . Smoker 01/01/2007  . Essential hypertension 01/01/2007  . ADD 06/11/2006    Current Outpatient Medications on File Prior to Visit  Medication Sig Dispense Refill  . fluocinolone (VANOS) 0.01 % cream Apply topically 2 (two) times daily. 60 g 1  . fluticasone (FLONASE) 50 MCG/ACT nasal spray Place 1 spray into both nostrils 2 (two) times daily as needed for rhinitis. 16 g 0  . hydrochlorothiazide (HYDRODIURIL) 25 MG tablet TAKE 1/2 BY MOUTH DAILY 45 tablet 3  . lisinopril (PRINIVIL,ZESTRIL) 40 MG tablet Take 1 tablet (40 mg total) by mouth daily. 90 tablet 3  . meloxicam (MOBIC) 15 MG tablet Take 1 tablet (15 mg total) by mouth daily. --- Office visit needed for further refills 90 tablet 0  . metoprolol succinate (TOPROL-XL) 50 MG 24 hr tablet Take 1 tablet (50 mg total) by mouth daily. Take with or immediately following a meal. 90 tablet 3  . sertraline (ZOLOFT) 100 MG tablet TAKE 1 (100 MG TOTAL) BY MOUTH DAILY 90 tablet 3   No current facility-administered medications on file prior to visit.     Past Medical History:  Diagnosis Date  . ADD (attention deficit disorder)   . Allergy    . Arthritis   . Depression   . Fasting hyperglycemia   . Hyperlipidemia   . Hypertension     Past Surgical History:  Procedure Laterality Date  . ABSCESS DRAINAGE  2012   L thigh, Dr Excell Seltzer (not MRSA)  . COLONOSCOPY  2014  . colonoscopy with polypectomy  2007   Dr Henrene Pastor  . CYSTECTOMY  2011   L knee, Dr French Ana  . ESI  2013    Dr Ernestina Patches  . ganglionectomy    . PILONIDAL CYST EXCISION     X 2  . VASECTOMY     X 2; Dr Karsten Ro    Social History   Socioeconomic History  . Marital status: Married    Spouse name: None  . Number of children: None  . Years of education: None  . Highest education level: None  Social Needs  . Financial resource strain: None  . Food insecurity - worry: None  . Food insecurity - inability: None  . Transportation needs - medical: None  . Transportation needs - non-medical: None  Occupational History  . None  Tobacco Use  . Smoking status: Current Every Day Smoker    Packs/day: 1.50    Types: Cigarettes  . Smokeless tobacco: Never Used  . Tobacco comment: smokedage18- present , up to 2 ppd for 10 years. 08/09/14 6-7 cigarettes per day.  Substance and Sexual Activity  .  Alcohol use: Yes    Comment: 28-32  glasses/ week of wine  . Drug use: No  . Sexual activity: None  Other Topics Concern  . None  Social History Narrative   Walking regularly for exercise   Plays golf    Family History  Problem Relation Age of Onset  . Alzheimer's disease Mother   . Depression Father   . Kidney disease Father        ESRD  . Colon cancer Maternal Grandmother 25  . Heart attack Paternal Grandfather 76  . Heart attack Maternal Grandfather 76        pacer  . Stroke Neg Hx   . Diabetes Neg Hx   . Esophageal cancer Neg Hx   . Rectal cancer Neg Hx   . Stomach cancer Neg Hx     Review of Systems  Constitutional: Negative for chills and fever.  Eyes: Negative for visual disturbance.  Respiratory: Positive for cough (occ, allergies). Negative  for shortness of breath and wheezing.   Cardiovascular: Negative for chest pain, palpitations and leg swelling.  Gastrointestinal: Positive for diarrhea. Negative for abdominal pain, blood in stool, constipation and nausea.       No gerd  Genitourinary: Negative for dysuria and hematuria.  Musculoskeletal: Positive for arthralgias (hands) and back pain (intermittent, flexeril prn).  Skin: Negative for color change and rash.  Neurological: Negative for light-headedness and headaches.  Psychiatric/Behavioral: Negative for dysphoric mood. The patient is nervous/anxious (controlled).        Objective:   Vitals:   04/09/17 0942  BP: 132/76  Pulse: 60  Resp: 16  Temp: 97.9 F (36.6 C)  SpO2: 97%   Filed Weights   04/09/17 0942  Weight: 249 lb (112.9 kg)   Body mass index is 35.73 kg/m.  Wt Readings from Last 3 Encounters:  04/09/17 249 lb (112.9 kg)  04/26/16 250 lb (113.4 kg)  04/06/15 244 lb (110.7 kg)     Physical Exam Constitutional: He appears well-developed and well-nourished. No distress.  HENT:  Head: Normocephalic and atraumatic.  Right Ear: External ear normal.  Left Ear: External ear normal.  Mouth/Throat: Oropharynx is clear and moist.  Normal ear canals and TM b/l  Eyes: Conjunctivae and EOM are normal.  Neck: Neck supple. No tracheal deviation present. No thyromegaly present.  No carotid bruit  Cardiovascular: Normal rate, regular rhythm, normal heart sounds and intact distal pulses.   No murmur heard. Pulmonary/Chest: Effort normal and breath sounds normal. No respiratory distress. He has no wheezes. He has no rales.  Abdominal: Soft. He exhibits no distension. There is no tenderness.  Genitourinary: deferred  Musculoskeletal: He exhibits no edema.  Lymphadenopathy:   He has no cervical adenopathy.  Skin: Skin is warm and dry. He is not diaphoretic.  Psychiatric: He has a normal mood and affect. His behavior is normal.         Assessment & Plan:     Physical exam: Screening blood work   ordered Immunizations   Flu vaccine  Up to date, Td up to date, discussed shingrix Colonoscopy  Up to date  Eye exams   Up to date  EKG   Last done 2014 Exercise plays golf , walks - more in nicer weather Weight  Weight loss advised Skin  No concerns Substance abuse   Smoking,  Drinks excessive alcohol - advised qutting smoking - may try an e-cig, advised decreasing alcohol  use  See Problem List for Assessment and Plan  of chronic medical problems.   FU in one year

## 2017-04-08 NOTE — Patient Instructions (Addendum)
Test(s) ordered today. Your results will be released to MyChart (or called to you) after review, usually within 72hours after test completion. If any changes need to be made, you will be notified at that same time.  All other Health Maintenance issues reviewed.   All recommended immunizations and age-appropriate screenings are up-to-date or discussed.  No immunizations administered today.   Medications reviewed and updated.  No changes recommended at this time.   Please followup in one year    Health Maintenance, Male A healthy lifestyle and preventive care is important for your health and wellness. Ask your health care provider about what schedule of regular examinations is right for you. What should I know about weight and diet? Eat a Healthy Diet  Eat plenty of vegetables, fruits, whole grains, low-fat dairy products, and lean protein.  Do not eat a lot of foods high in solid fats, added sugars, or salt.  Maintain a Healthy Weight Regular exercise can help you achieve or maintain a healthy weight. You should:  Do at least 150 minutes of exercise each week. The exercise should increase your heart rate and make you sweat (moderate-intensity exercise).  Do strength-training exercises at least twice a week.  Watch Your Levels of Cholesterol and Blood Lipids  Have your blood tested for lipids and cholesterol every 5 years starting at 65 years of age. If you are at high risk for heart disease, you should start having your blood tested when you are 65 years old. You may need to have your cholesterol levels checked more often if: ? Your lipid or cholesterol levels are high. ? You are older than 65 years of age. ? You are at high risk for heart disease.  What should I know about cancer screening? Many types of cancers can be detected early and may often be prevented. Lung Cancer  You should be screened every year for lung cancer if: ? You are a current smoker who has smoked for  at least 30 years. ? You are a former smoker who has quit within the past 15 years.  Talk to your health care provider about your screening options, when you should start screening, and how often you should be screened.  Colorectal Cancer  Routine colorectal cancer screening usually begins at 65 years of age and should be repeated every 5-10 years until you are 65 years old. You may need to be screened more often if early forms of precancerous polyps or small growths are found. Your health care provider may recommend screening at an earlier age if you have risk factors for colon cancer.  Your health care provider may recommend using home test kits to check for hidden blood in the stool.  A small camera at the end of a tube can be used to examine your colon (sigmoidoscopy or colonoscopy). This checks for the earliest forms of colorectal cancer.  Prostate and Testicular Cancer  Depending on your age and overall health, your health care provider may do certain tests to screen for prostate and testicular cancer.  Talk to your health care provider about any symptoms or concerns you have about testicular or prostate cancer.  Skin Cancer  Check your skin from head to toe regularly.  Tell your health care provider about any new moles or changes in moles, especially if: ? There is a change in a mole's size, shape, or color. ? You have a mole that is larger than a pencil eraser.  Always use sunscreen. Apply sunscreen liberally   and repeat throughout the day.  Protect yourself by wearing long sleeves, pants, a wide-brimmed hat, and sunglasses when outside.  What should I know about heart disease, diabetes, and high blood pressure?  If you are 18-39 years of age, have your blood pressure checked every 3-5 years. If you are 40 years of age or older, have your blood pressure checked every year. You should have your blood pressure measured twice-once when you are at a hospital or clinic, and once  when you are not at a hospital or clinic. Record the average of the two measurements. To check your blood pressure when you are not at a hospital or clinic, you can use: ? An automated blood pressure machine at a pharmacy. ? A home blood pressure monitor.  Talk to your health care provider about your target blood pressure.  If you are between 45-79 years old, ask your health care provider if you should take aspirin to prevent heart disease.  Have regular diabetes screenings by checking your fasting blood sugar level. ? If you are at a normal weight and have a low risk for diabetes, have this test once every three years after the age of 45. ? If you are overweight and have a high risk for diabetes, consider being tested at a younger age or more often.  A one-time screening for abdominal aortic aneurysm (AAA) by ultrasound is recommended for men aged 65-75 years who are current or former smokers. What should I know about preventing infection? Hepatitis B If you have a higher risk for hepatitis B, you should be screened for this virus. Talk with your health care provider to find out if you are at risk for hepatitis B infection. Hepatitis C Blood testing is recommended for:  Everyone born from 1945 through 1965.  Anyone with known risk factors for hepatitis C.  Sexually Transmitted Diseases (STDs)  You should be screened each year for STDs including gonorrhea and chlamydia if: ? You are sexually active and are younger than 65 years of age. ? You are older than 65 years of age and your health care provider tells you that you are at risk for this type of infection. ? Your sexual activity has changed since you were last screened and you are at an increased risk for chlamydia or gonorrhea. Ask your health care provider if you are at risk.  Talk with your health care provider about whether you are at high risk of being infected with HIV. Your health care provider may recommend a prescription  medicine to help prevent HIV infection.  What else can I do?  Schedule regular health, dental, and eye exams.  Stay current with your vaccines (immunizations).  Do not use any tobacco products, such as cigarettes, chewing tobacco, and e-cigarettes. If you need help quitting, ask your health care provider.  Limit alcohol intake to no more than 2 drinks per day. One drink equals 12 ounces of beer, 5 ounces of wine, or 1 ounces of hard liquor.  Do not use street drugs.  Do not share needles.  Ask your health care provider for help if you need support or information about quitting drugs.  Tell your health care provider if you often feel depressed.  Tell your health care provider if you have ever been abused or do not feel safe at home. This information is not intended to replace advice given to you by your health care provider. Make sure you discuss any questions you have with your health   care provider. Document Released: 07/06/2007 Document Revised: 09/06/2015 Document Reviewed: 10/11/2014 Elsevier Interactive Patient Education  2018 [employer]  

## 2017-04-09 ENCOUNTER — Ambulatory Visit (INDEPENDENT_AMBULATORY_CARE_PROVIDER_SITE_OTHER): Payer: No Typology Code available for payment source | Admitting: Internal Medicine

## 2017-04-09 ENCOUNTER — Encounter: Payer: Self-pay | Admitting: Internal Medicine

## 2017-04-09 ENCOUNTER — Other Ambulatory Visit (INDEPENDENT_AMBULATORY_CARE_PROVIDER_SITE_OTHER): Payer: No Typology Code available for payment source

## 2017-04-09 VITALS — BP 132/76 | HR 60 | Temp 97.9°F | Resp 16 | Ht 70.0 in | Wt 249.0 lb

## 2017-04-09 DIAGNOSIS — R739 Hyperglycemia, unspecified: Secondary | ICD-10-CM | POA: Insufficient documentation

## 2017-04-09 DIAGNOSIS — E782 Mixed hyperlipidemia: Secondary | ICD-10-CM | POA: Diagnosis not present

## 2017-04-09 DIAGNOSIS — G8929 Other chronic pain: Secondary | ICD-10-CM | POA: Diagnosis not present

## 2017-04-09 DIAGNOSIS — M1990 Unspecified osteoarthritis, unspecified site: Secondary | ICD-10-CM | POA: Insufficient documentation

## 2017-04-09 DIAGNOSIS — R7303 Prediabetes: Secondary | ICD-10-CM | POA: Insufficient documentation

## 2017-04-09 DIAGNOSIS — I10 Essential (primary) hypertension: Secondary | ICD-10-CM

## 2017-04-09 DIAGNOSIS — F3289 Other specified depressive episodes: Secondary | ICD-10-CM

## 2017-04-09 DIAGNOSIS — M545 Low back pain: Secondary | ICD-10-CM

## 2017-04-09 DIAGNOSIS — F1010 Alcohol abuse, uncomplicated: Secondary | ICD-10-CM | POA: Diagnosis not present

## 2017-04-09 DIAGNOSIS — M1991 Primary osteoarthritis, unspecified site: Secondary | ICD-10-CM | POA: Diagnosis not present

## 2017-04-09 DIAGNOSIS — Z125 Encounter for screening for malignant neoplasm of prostate: Secondary | ICD-10-CM

## 2017-04-09 DIAGNOSIS — Z0000 Encounter for general adult medical examination without abnormal findings: Secondary | ICD-10-CM | POA: Diagnosis not present

## 2017-04-09 DIAGNOSIS — F17200 Nicotine dependence, unspecified, uncomplicated: Secondary | ICD-10-CM | POA: Diagnosis not present

## 2017-04-09 LAB — COMPREHENSIVE METABOLIC PANEL
ALBUMIN: 4.9 g/dL (ref 3.5–5.2)
ALT: 24 U/L (ref 0–53)
AST: 22 U/L (ref 0–37)
Alkaline Phosphatase: 58 U/L (ref 39–117)
BUN: 18 mg/dL (ref 6–23)
CHLORIDE: 106 meq/L (ref 96–112)
CO2: 21 meq/L (ref 19–32)
CREATININE: 1.07 mg/dL (ref 0.40–1.50)
Calcium: 10.1 mg/dL (ref 8.4–10.5)
GFR: 73.81 mL/min (ref >60.00)
GLUCOSE: 128 mg/dL — AB (ref 70–99)
POTASSIUM: 4.6 meq/L (ref 3.5–5.1)
SODIUM: 142 meq/L (ref 135–145)
Total Bilirubin: 0.3 mg/dL (ref 0.2–1.2)
Total Protein: 7.3 g/dL (ref 6.0–8.3)

## 2017-04-09 LAB — LIPID PANEL
CHOL/HDL RATIO: 4
CHOLESTEROL: 217 mg/dL — AB (ref 0–200)
HDL: 54.8 mg/dL (ref >39.00)
LDL CALC: 132 mg/dL — AB (ref 0–99)
NONHDL: 162.17
Triglycerides: 149 mg/dL (ref 0.0–149.0)
VLDL: 29.8 mg/dL (ref 0.0–40.0)

## 2017-04-09 LAB — CBC WITH DIFFERENTIAL/PLATELET
BASOS PCT: 0.9 % (ref 0.0–3.0)
Basophils Absolute: 0.1 10*3/uL (ref 0.0–0.1)
EOS PCT: 1.8 % (ref 0.0–5.0)
Eosinophils Absolute: 0.1 10*3/uL (ref 0.0–0.7)
HEMATOCRIT: 45.6 % (ref 39.0–52.0)
Hemoglobin: 15.9 g/dL (ref 13.0–17.0)
LYMPHS ABS: 2.6 10*3/uL (ref 0.7–4.0)
LYMPHS PCT: 33.7 % (ref 12.0–46.0)
MCHC: 34.8 g/dL (ref 30.0–36.0)
MCV: 103.4 fl — AB (ref 78.0–100.0)
MONOS PCT: 9.1 % (ref 3.0–12.0)
Monocytes Absolute: 0.7 10*3/uL (ref 0.1–1.0)
NEUTROS ABS: 4.2 10*3/uL (ref 1.4–7.7)
NEUTROS PCT: 54.5 % (ref 43.0–77.0)
PLATELETS: 199 10*3/uL (ref 150.0–400.0)
RBC: 4.41 Mil/uL (ref 4.22–5.81)
RDW: 13.6 % (ref 11.5–15.5)
WBC: 7.8 10*3/uL (ref 4.0–10.5)

## 2017-04-09 LAB — TSH: TSH: 0.85 u[IU]/mL (ref 0.35–4.50)

## 2017-04-09 LAB — PSA, MEDICARE: PSA: 0.96 ng/ml (ref 0.10–4.00)

## 2017-04-09 LAB — HEMOGLOBIN A1C: Hgb A1c MFr Bld: 5.5 % (ref 4.6–6.5)

## 2017-04-09 MED ORDER — MELOXICAM 15 MG PO TABS: 15.0000 mg | ORAL_TABLET | Freq: Every day | ORAL | 3 refills | Status: DC

## 2017-04-09 MED ORDER — LISINOPRIL 40 MG PO TABS: 40.0000 mg | ORAL_TABLET | Freq: Every day | ORAL | 3 refills | Status: DC

## 2017-04-09 MED ORDER — CYCLOBENZAPRINE HCL 10 MG PO TABS: 10.0000 mg | ORAL_TABLET | Freq: Three times a day (TID) | ORAL | 0 refills | Status: DC | PRN

## 2017-04-09 MED ORDER — HYDROCHLOROTHIAZIDE 25 MG PO TABS: ORAL_TABLET | ORAL | 3 refills | Status: DC

## 2017-04-09 MED ORDER — METOPROLOL SUCCINATE ER 50 MG PO TB24: 50.0000 mg | ORAL_TABLET | Freq: Every day | ORAL | 3 refills | Status: DC

## 2017-04-09 MED ORDER — SERTRALINE HCL 100 MG PO TABS: ORAL_TABLET | ORAL | 3 refills | Status: DC

## 2017-04-09 NOTE — Assessment & Plan Note (Signed)
Check a1c Low sugar / carb diet Stressed regular exercise,weight loss Advised smoking cessation and decreasing alcohol use

## 2017-04-09 NOTE — Assessment & Plan Note (Signed)
Intermittent back pain Uses flexeril prn - rarely

## 2017-04-09 NOTE — Assessment & Plan Note (Signed)
BP well controlled Current regimen effective and well tolerated Continue current medications at current doses cmp  

## 2017-04-09 NOTE — Assessment & Plan Note (Signed)
Controlled, stable Continue current dose of medication  

## 2017-04-09 NOTE — Assessment & Plan Note (Signed)
Taking meloxicam most days Helpful, will continue Discussed possible side effects on stomach and kidney cmp

## 2017-04-09 NOTE — Assessment & Plan Note (Signed)
Check lipid panel, tsh, cmp Regular exercise and healthy diet encouraged  

## 2017-04-09 NOTE — Assessment & Plan Note (Signed)
Advised to quit He plans on trying an e-cig

## 2017-04-09 NOTE — Assessment & Plan Note (Signed)
Drinks a bottle of wine a day, sometimes more Advised cutting back and he is trying

## 2017-04-10 ENCOUNTER — Encounter: Payer: Self-pay | Admitting: Internal Medicine

## 2017-04-10 LAB — PSA, TOTAL AND FREE
PSA, % FREE: 33 % (ref >25)
PSA, Free: 0.3 ng/mL
PSA, TOTAL: 0.9 ng/mL (ref <=4.0)

## 2017-04-15 ENCOUNTER — Encounter: Payer: No Typology Code available for payment source | Admitting: Internal Medicine

## 2017-09-15 DIAGNOSIS — H25813 Combined forms of age-related cataract, bilateral: Secondary | ICD-10-CM | POA: Diagnosis not present

## 2017-09-15 DIAGNOSIS — H16103 Unspecified superficial keratitis, bilateral: Secondary | ICD-10-CM | POA: Diagnosis not present

## 2017-09-15 DIAGNOSIS — H52203 Unspecified astigmatism, bilateral: Secondary | ICD-10-CM | POA: Diagnosis not present

## 2017-09-15 DIAGNOSIS — H0015 Chalazion left lower eyelid: Secondary | ICD-10-CM | POA: Diagnosis not present

## 2017-11-05 DIAGNOSIS — R69 Illness, unspecified: Secondary | ICD-10-CM | POA: Diagnosis not present

## 2018-02-09 DIAGNOSIS — R69 Illness, unspecified: Secondary | ICD-10-CM | POA: Diagnosis not present

## 2018-02-10 DIAGNOSIS — L718 Other rosacea: Secondary | ICD-10-CM | POA: Diagnosis not present

## 2018-02-10 DIAGNOSIS — L281 Prurigo nodularis: Secondary | ICD-10-CM | POA: Diagnosis not present

## 2018-02-10 DIAGNOSIS — L738 Other specified follicular disorders: Secondary | ICD-10-CM | POA: Diagnosis not present

## 2018-02-10 DIAGNOSIS — L400 Psoriasis vulgaris: Secondary | ICD-10-CM | POA: Diagnosis not present

## 2018-04-07 ENCOUNTER — Other Ambulatory Visit: Payer: Self-pay | Admitting: Internal Medicine

## 2018-05-20 ENCOUNTER — Encounter: Payer: Self-pay | Admitting: Internal Medicine

## 2018-05-20 ENCOUNTER — Ambulatory Visit (INDEPENDENT_AMBULATORY_CARE_PROVIDER_SITE_OTHER): Payer: Medicare HMO | Admitting: Internal Medicine

## 2018-05-20 ENCOUNTER — Other Ambulatory Visit: Payer: Self-pay

## 2018-05-20 ENCOUNTER — Other Ambulatory Visit: Payer: Self-pay | Admitting: Internal Medicine

## 2018-05-20 DIAGNOSIS — R69 Illness, unspecified: Secondary | ICD-10-CM | POA: Diagnosis not present

## 2018-05-20 DIAGNOSIS — E782 Mixed hyperlipidemia: Secondary | ICD-10-CM

## 2018-05-20 DIAGNOSIS — F3289 Other specified depressive episodes: Secondary | ICD-10-CM | POA: Diagnosis not present

## 2018-05-20 DIAGNOSIS — F17210 Nicotine dependence, cigarettes, uncomplicated: Secondary | ICD-10-CM | POA: Diagnosis not present

## 2018-05-20 DIAGNOSIS — R739 Hyperglycemia, unspecified: Secondary | ICD-10-CM | POA: Diagnosis not present

## 2018-05-20 DIAGNOSIS — Z720 Tobacco use: Secondary | ICD-10-CM

## 2018-05-20 DIAGNOSIS — I10 Essential (primary) hypertension: Secondary | ICD-10-CM | POA: Diagnosis not present

## 2018-05-20 DIAGNOSIS — M1991 Primary osteoarthritis, unspecified site: Secondary | ICD-10-CM

## 2018-05-20 DIAGNOSIS — F1010 Alcohol abuse, uncomplicated: Secondary | ICD-10-CM | POA: Diagnosis not present

## 2018-05-20 MED ORDER — LISINOPRIL 40 MG PO TABS: 40.0000 mg | ORAL_TABLET | Freq: Every day | ORAL | 1 refills | Status: DC

## 2018-05-20 MED ORDER — HYDROCHLOROTHIAZIDE 25 MG PO TABS: ORAL_TABLET | ORAL | 3 refills | Status: DC

## 2018-05-20 MED ORDER — METOPROLOL SUCCINATE ER 50 MG PO TB24: ORAL_TABLET | ORAL | 1 refills | Status: DC

## 2018-05-20 MED ORDER — MELOXICAM 15 MG PO TABS: 15.0000 mg | ORAL_TABLET | Freq: Every day | ORAL | 3 refills | Status: DC

## 2018-05-20 MED ORDER — SERTRALINE HCL 100 MG PO TABS: ORAL_TABLET | ORAL | 1 refills | Status: DC

## 2018-05-20 NOTE — Progress Notes (Signed)
Virtual Visit via Video Note  I connected with Andrew Woodard on 05/20/18 at  3:00 PM EDT by a video enabled telemedicine application and verified that I am speaking with the correct person using two identifiers.   I discussed the limitations of evaluation and management by telemedicine and the availability of in person appointments. The patient expressed understanding and agreed to proceed.  The patient is currently at home and I am in the office.    No referring provider.    History of Present Illness: He is here for follow up of his chronic medical conditions.    He is exercising - walks while he plays golf 2-3 times a week, cuts grass.    Hypertension: He is taking his medication daily. He is compliant with a low sodium diet.  He denies chest pain, palpitations, edema, shortness of breath and regular headaches.  He does not monitor his blood pressure at home - not sure if his BP cuff is accurate.    Depression: He is taking his medication daily as prescribed. He denies any side effects from the medication. He feels his depression is well controlled and he is happy with his current dose of medication.   Hyperlipidemia: He is not on any medication daily. He is compliant with a low fat/cholesterol diet.   Hyperglycemia:  He is not compliant with a low sugar/carbohydrate diet.  He is exercising.  Osteoarthritis:  He has arthritis of his hands and back.  He takes meloxicam most days and it helps.    Tobacco abuse:  He is still smoking.    Alcohol abuse:  He drinks about one bottle of wine a day.     Review of Systems  Constitutional: Negative for chills and fever.  Respiratory: Positive for cough. Negative for shortness of breath and wheezing.   Cardiovascular: Negative for chest pain, palpitations and leg swelling.  Neurological: Negative for headaches.     Social History   Socioeconomic History  . Marital status: Married    Spouse name: Not on file  . Number of children:  Not on file  . Years of education: Not on file  . Highest education level: Not on file  Occupational History  . Not on file  Social Needs  . Financial resource strain: Not on file  . Food insecurity:    Worry: Not on file    Inability: Not on file  . Transportation needs:    Medical: Not on file    Non-medical: Not on file  Tobacco Use  . Smoking status: Current Every Day Smoker    Packs/day: 1.50    Types: Cigarettes  . Smokeless tobacco: Never Used  . Tobacco comment: smokedage18- present , up to 2 ppd for 10 years. 08/09/14 6-7 cigarettes per day.  Substance and Sexual Activity  . Alcohol use: Yes    Comment: 28-32  glasses/ week of wine  . Drug use: No  . Sexual activity: Not on file  Lifestyle  . Physical activity:    Days per week: Not on file    Minutes per session: Not on file  . Stress: Not on file  Relationships  . Social connections:    Talks on phone: Not on file    Gets together: Not on file    Attends religious service: Not on file    Active member of club or organization: Not on file    Attends meetings of clubs or organizations: Not on file    Relationship status:  Not on file  Other Topics Concern  . Not on file  Social History Narrative   Walking regularly for exercise   Plays golf     Observations/Objective: Appears well in NAD  HR at rest 50-52 Does not check BP  Assessment and Plan:  See Problem List for Assessment and Plan of chronic medical problems.   Follow Up Instructions:    I discussed the assessment and treatment plan with the patient. The patient was provided an opportunity to ask questions and all were answered. The patient agreed with the plan and demonstrated an understanding of the instructions.   The patient was advised to call back or seek an in-person evaluation if the symptoms worsen or if the condition fails to improve as anticipated.    Binnie Rail, MD;

## 2018-05-20 NOTE — Telephone Encounter (Signed)
LVM for pt to schedule a virtual follow up with Dr. Quay Burow since he has not been seen in over a year.

## 2018-05-20 NOTE — Assessment & Plan Note (Signed)
Does not check his BP at home  BP Readings from Last 3 Encounters:  04/09/17 132/76  04/26/16 (!) 144/86  04/06/15 120/84   Has been controlled Continue current medication Ideally start checking BP at home

## 2018-05-20 NOTE — Assessment & Plan Note (Signed)
Smoking daily - knows he should quit.  Smoking cessation was discussed for more than 3 minutes.  The patient was counseled on the dangers of tobacco use, and was advised to quit.  Reviewed ways of quitting smoking including nicotine replacement, vapping/e-cigarettes, cold [place], weaning off cigarettes, and pharmacotherapy (wellbutrin and chantix). He has tried wellbutrin and did not tolerate it.  He may consider Chantix  - reviewed possible side effects.    He does want to quit, but is not 100% committed to quitting.  Advised him to let me know if and when he wants to try Chantix

## 2018-05-20 NOTE — Assessment & Plan Note (Signed)
Controlled, stable - he feels his medication is working well Continue current dose of medication

## 2018-05-20 NOTE — Assessment & Plan Note (Signed)
Compliant with a low sugar/carb, but drinking alcohol daily which he understands is a lot of sugar -- advised decreasing alcohol intake Will check a1c at his next visit

## 2018-05-20 NOTE — Assessment & Plan Note (Signed)
Drinks one bottle of wine daily Advised to decrease the amount he is drinking

## 2018-05-20 NOTE — Assessment & Plan Note (Signed)
Not on medication Will recheck lipids at his next visit

## 2018-05-20 NOTE — Assessment & Plan Note (Signed)
Taking meloxicam daily, which helps [place] to continue

## 2018-06-14 ENCOUNTER — Ambulatory Visit: Payer: Self-pay | Admitting: Internal Medicine

## 2018-06-14 NOTE — Telephone Encounter (Signed)
Pt.'s wife repots pt. Started feeling bad yesterday. Has temp. 100.4 and chills. They are concerned about waiting to see PCP when office is open due "to he has a lot of risk factors." Wife would feel better if pt. Is seen today. Will go to ED for evaluation.  Reason for Disposition . HIGH RISK patient (e.g., age > 31 years, diabetes, heart or lung disease, weak immune system) . Patient sounds very sick or weak to the triager  Answer Assessment - Initial Assessment Questions 1. COVID-19 DIAGNOSIS: "Who made your Coronavirus (COVID-19) diagnosis?" "Was it confirmed by a positive lab test?" If not diagnosed by a HCP, ask "Are there lots of cases (community spread) where you live?" (See public health department website, if unsure)   * MAJOR community spread: high number of cases; numbers of cases are increasing; many people hospitalized.   * MINOR community spread: low number of cases; not increasing; few or no people hospitalized     No testing 2. ONSET: "When did the COVID-19 symptoms start?"      Yesterday 3. WORST SYMPTOM: "What is your worst symptom?" (e.g., cough, fever, shortness of breath, muscle aches)     Fever, chills 4. COUGH: "Do you have a cough?" If so, ask: "How bad is the cough?"       No 5. FEVER: "Do you have a fever?" If so, ask: "What is your temperature, how was it measured, and when did it start?"     100.4 6. RESPIRATORY STATUS: "Describe your breathing?" (e.g., shortness of breath, wheezing, unable to speak)      No problem 7. BETTER-SAME-WORSE: "Are you getting better, staying the same or getting worse compared to yesterday?"  If getting worse, ask, "In what way?"     Worse 8. HIGH RISK DISEASE: "Do you have any chronic medical problems?" (e.g., asthma, heart or lung disease, weak immune system, etc.)     HTN 9. PREGNANCY: "Is there any chance you are pregnant?" "When was your last menstrual period?"     N/A 10. OTHER SYMPTOMS: "Do you have any other symptoms?"   (e.g., runny nose, headache, sore throat, loss of smell)       No  Protocols used: CORONAVIRUS (COVID-19) DIAGNOSED OR SUSPECTED-A-AH

## 2018-06-15 DIAGNOSIS — Z1159 Encounter for screening for other viral diseases: Secondary | ICD-10-CM | POA: Diagnosis not present

## 2018-06-16 NOTE — Telephone Encounter (Signed)
FYI

## 2018-06-16 NOTE — Telephone Encounter (Signed)
noted 

## 2018-07-28 DIAGNOSIS — R69 Illness, unspecified: Secondary | ICD-10-CM | POA: Diagnosis not present

## 2018-10-14 DIAGNOSIS — R69 Illness, unspecified: Secondary | ICD-10-CM | POA: Diagnosis not present

## 2018-11-24 NOTE — Patient Instructions (Addendum)
  Tests ordered today. Your results will be released to MyChart (or called to you) after review.  If any changes need to be made, you will be notified at that same time.    Medications reviewed and updated.  Changes include :   none  Your prescription(s) have been submitted to your pharmacy. Please take as directed and contact our office if you believe you are having problem(s) with the medication(s).   Please followup in 1 year   

## 2018-11-24 NOTE — Progress Notes (Signed)
Subjective:    Patient ID: Andrew Woodard, male    DOB: 1950-11-18, 68 y.o.   MRN: [id number]  HPI The patient is here for follow up.  Overall he feels good and has no major concerns.  He is exercising regularly - golf, active, yard work.    Hypertension: He is taking his medication daily. He is compliant with a low sodium diet.  He denies chest pain, palpitations, edema, shortness of breath and regular headaches.  Depression: He is taking his medication daily as prescribed. He denies any side effects from the medication. He feels his depression is well controlled and he is unsure if he really needs it.  He wonders if he can try to stop it.  It causes diarrhea.  His only concern about stopping the medication is that he does get depression on cloudy days.  Hyperlipidemia: He is not taking his medication daily. He is compliant with a low fat/cholesterol diet.   Hyperglycemia:  He is compliant with a low sugar/carbohydrate diet.  He is exercising regularly.  Tobacco abuse:    He has cut back.  He has smoked since he was in 3rd grade.  He knows he should quit, but he is not committed to quit.    Alcohol abuse:  He drinks one bottle of wine or more a night. He does not have any desire to decrease the amount that he drinks.    Medications and allergies reviewed with patient and updated if appropriate.  Patient Active Problem List   Diagnosis Date Noted  . Hyperglycemia 04/09/2017  . Osteoarthritis 04/09/2017  . Alcohol abuse 04/26/2016  . Depression 04/06/2015  . Nonspecific abnormal electrocardiogram (ECG) (EKG) 04/13/2012  . LOW BACK PAIN SYNDROME 10/09/2009  . COLONIC POLYPS, HX OF 06/07/2008  . LIPOMAS, MULTIPLE 01/26/2008  . HYPERLIPIDEMIA 01/01/2007  . DYSMETABOLIC SYNDROME X 123456  . Tobacco abuse 01/01/2007  . Essential hypertension 01/01/2007  . ADD 06/11/2006    Current Outpatient Medications on File Prior to Visit  Medication Sig Dispense Refill  .  cyclobenzaprine (FLEXERIL) 10 MG tablet Take 1 tablet (10 mg total) by mouth 3 (three) times daily as needed for muscle spasms. 30 tablet 0  . fluocinolone (VANOS) 0.01 % cream Apply topically 2 (two) times daily. 60 g 1  . hydrochlorothiazide (HYDRODIURIL) 25 MG tablet TAKE 1/2 BY MOUTH DAILY 45 tablet 3  . lisinopril (ZESTRIL) 40 MG tablet Take 1 tablet (40 mg total) by mouth daily. 90 tablet 1  . meloxicam (MOBIC) 15 MG tablet Take 1 tablet (15 mg total) by mouth daily. 90 tablet 3  . metoprolol succinate (TOPROL-XL) 50 MG 24 hr tablet Take 1 tablet by mouth once daily. Take with or immediately following a meal. 90 tablet 1  . sertraline (ZOLOFT) 100 MG tablet Take 1 tablet by mouth once daily. 90 tablet 1   No current facility-administered medications on file prior to visit.     Past Medical History:  Diagnosis Date  . ADD (attention deficit disorder)   . Allergy   . Arthritis   . Depression   . Fasting hyperglycemia   . Hyperlipidemia   . Hypertension     Past Surgical History:  Procedure Laterality Date  . ABSCESS DRAINAGE  2012   L thigh, Dr Excell Seltzer (not MRSA)  . COLONOSCOPY  2014  . colonoscopy with polypectomy  2007   Dr Henrene Pastor  . CYSTECTOMY  2011   L knee, Dr French Ana  . ESI  2013  Dr Ernestina Patches  . ganglionectomy    . PILONIDAL CYST EXCISION     X 2  . VASECTOMY     X 2; Dr Karsten Ro    Social History   Socioeconomic History  . Marital status: Married    Spouse name: Not on file  . Number of children: Not on file  . Years of education: Not on file  . Highest education level: Not on file  Occupational History  . Not on file  Social Needs  . Financial resource strain: Not on file  . Food insecurity    Worry: Not on file    Inability: Not on file  . Transportation needs    Medical: Not on file    Non-medical: Not on file  Tobacco Use  . Smoking status: Current Every Day Smoker    Packs/day: 1.50    Types: Cigarettes  . Smokeless tobacco: Never Used  .  Tobacco comment: smokedage18- present , up to 2 ppd for 10 years. 08/09/14 6-7 cigarettes per day.  Substance and Sexual Activity  . Alcohol use: Yes    Comment: 28-32  glasses/ week of wine  . Drug use: No  . Sexual activity: Not on file  Lifestyle  . Physical activity    Days per week: Not on file    Minutes per session: Not on file  . Stress: Not on file  Relationships  . Social Herbalist on phone: Not on file    Gets together: Not on file    Attends religious service: Not on file    Active member of club or organization: Not on file    Attends meetings of clubs or organizations: Not on file    Relationship status: Not on file  Other Topics Concern  . Not on file  Social History Narrative   Walking regularly for exercise   Plays golf    Family History  Problem Relation Age of Onset  . Alzheimer's disease Mother   . Depression Father   . Kidney disease Father        ESRD  . Colon cancer Maternal Grandmother 40  . Heart attack Paternal Grandfather 76  . Heart attack Maternal Grandfather 76        pacer  . Stroke Neg Hx   . Diabetes Neg Hx   . Esophageal cancer Neg Hx   . Rectal cancer Neg Hx   . Stomach cancer Neg Hx     Review of Systems  Constitutional: Negative for chills and fever.  Respiratory: Positive for cough (from smoking, some mucus). Negative for shortness of breath and wheezing.   Cardiovascular: Negative for chest pain, palpitations and leg swelling.  Neurological: Negative for light-headedness and headaches.       Objective:   Vitals:   11/25/18 0947  BP: 122/74  Pulse: 78  Resp: 16  Temp: 98.9 F (37.2 C)  SpO2: 96%   BP Readings from Last 3 Encounters:  11/25/18 122/74  04/09/17 132/76  04/26/16 (!) 144/86   Wt Readings from Last 3 Encounters:  11/25/18 240 lb 12.8 oz (109.2 kg)  04/09/17 249 lb (112.9 kg)  04/26/16 250 lb (113.4 kg)   Body mass index is 34.55 kg/m.   Physical Exam    Constitutional: Appears  well-developed and well-nourished. No distress.  HENT:  Head: Normocephalic and atraumatic.  Neck: Neck supple. No tracheal deviation present. No thyromegaly present.  No cervical lymphadenopathy Cardiovascular: Normal rate, regular rhythm and normal  heart sounds.   No murmur heard. No carotid bruit .  No edema Pulmonary/Chest: Effort normal and breath sounds normal. No respiratory distress. No has no wheezes. No rales. Abdomen: Soft, ventral hernia-nontender, no diffuse tenderness, no distention Skin: Skin is warm and dry. Not diaphoretic.  Psychiatric: Normal mood and affect. Behavior is normal.      Assessment & Plan:    See Problem List for Assessment and Plan of chronic medical problems.

## 2018-11-25 ENCOUNTER — Other Ambulatory Visit: Payer: Self-pay

## 2018-11-25 ENCOUNTER — Encounter: Payer: Self-pay | Admitting: Internal Medicine

## 2018-11-25 ENCOUNTER — Other Ambulatory Visit (INDEPENDENT_AMBULATORY_CARE_PROVIDER_SITE_OTHER): Payer: Medicare HMO

## 2018-11-25 ENCOUNTER — Ambulatory Visit (INDEPENDENT_AMBULATORY_CARE_PROVIDER_SITE_OTHER): Payer: Medicare HMO | Admitting: Internal Medicine

## 2018-11-25 VITALS — BP 122/74 | HR 78 | Temp 98.9°F | Resp 16 | Ht 70.0 in | Wt 240.8 lb

## 2018-11-25 DIAGNOSIS — E782 Mixed hyperlipidemia: Secondary | ICD-10-CM | POA: Diagnosis not present

## 2018-11-25 DIAGNOSIS — R739 Hyperglycemia, unspecified: Secondary | ICD-10-CM | POA: Diagnosis not present

## 2018-11-25 DIAGNOSIS — Z720 Tobacco use: Secondary | ICD-10-CM

## 2018-11-25 DIAGNOSIS — R69 Illness, unspecified: Secondary | ICD-10-CM | POA: Diagnosis not present

## 2018-11-25 DIAGNOSIS — M545 Low back pain: Secondary | ICD-10-CM

## 2018-11-25 DIAGNOSIS — F1010 Alcohol abuse, uncomplicated: Secondary | ICD-10-CM | POA: Diagnosis not present

## 2018-11-25 DIAGNOSIS — Z125 Encounter for screening for malignant neoplasm of prostate: Secondary | ICD-10-CM

## 2018-11-25 DIAGNOSIS — I10 Essential (primary) hypertension: Secondary | ICD-10-CM | POA: Diagnosis not present

## 2018-11-25 DIAGNOSIS — G8929 Other chronic pain: Secondary | ICD-10-CM | POA: Diagnosis not present

## 2018-11-25 DIAGNOSIS — F3289 Other specified depressive episodes: Secondary | ICD-10-CM

## 2018-11-25 DIAGNOSIS — M1991 Primary osteoarthritis, unspecified site: Secondary | ICD-10-CM | POA: Diagnosis not present

## 2018-11-25 LAB — CBC WITH DIFFERENTIAL/PLATELET
Basophils Absolute: 0.1 10*3/uL (ref 0.0–0.1)
Basophils Relative: 1.2 % (ref 0.0–3.0)
Eosinophils Absolute: 0.1 10*3/uL (ref 0.0–0.7)
Eosinophils Relative: 1.5 % (ref 0.0–5.0)
HCT: 48 % (ref 39.0–52.0)
Hemoglobin: 16.6 g/dL (ref 13.0–17.0)
Lymphocytes Relative: 30.3 % (ref 12.0–46.0)
Lymphs Abs: 2.5 10*3/uL (ref 0.7–4.0)
MCHC: 34.6 g/dL (ref 30.0–36.0)
MCV: 105.7 fl — ABNORMAL HIGH (ref 78.0–100.0)
Monocytes Absolute: 0.7 10*3/uL (ref 0.1–1.0)
Monocytes Relative: 8.2 % (ref 3.0–12.0)
Neutro Abs: 4.9 10*3/uL (ref 1.4–7.7)
Neutrophils Relative %: 58.8 % (ref 43.0–77.0)
Platelets: 200 10*3/uL (ref 150.0–400.0)
RBC: 4.54 Mil/uL (ref 4.22–5.81)
RDW: 13.3 % (ref 11.5–15.5)
WBC: 8.4 10*3/uL (ref 4.0–10.5)

## 2018-11-25 LAB — TSH: TSH: 0.95 u[IU]/mL (ref 0.35–4.50)

## 2018-11-25 LAB — LIPID PANEL
Cholesterol: 186 mg/dL (ref 0–200)
HDL: 46.5 mg/dL (ref >39.00)
LDL Cholesterol: 112 mg/dL — ABNORMAL HIGH (ref 0–99)
NonHDL: 139.41
Total CHOL/HDL Ratio: 4
Triglycerides: 137 mg/dL (ref 0.0–149.0)
VLDL: 27.4 mg/dL (ref 0.0–40.0)

## 2018-11-25 LAB — COMPREHENSIVE METABOLIC PANEL
ALT: 24 U/L (ref 0–53)
AST: 20 U/L (ref 0–37)
Albumin: 4.5 g/dL (ref 3.5–5.2)
Alkaline Phosphatase: 56 U/L (ref 39–117)
BUN: 22 mg/dL (ref 6–23)
CO2: 23 mEq/L (ref 19–32)
Calcium: 9.5 mg/dL (ref 8.4–10.5)
Chloride: 103 mEq/L (ref 96–112)
Creatinine, Ser: 1.12 mg/dL (ref 0.40–1.50)
GFR: 65.55 mL/min (ref >60.00)
Glucose, Bld: 114 mg/dL — ABNORMAL HIGH (ref 70–99)
Potassium: 4.4 mEq/L (ref 3.5–5.1)
Sodium: 134 mEq/L — ABNORMAL LOW (ref 135–145)
Total Bilirubin: 0.7 mg/dL (ref 0.2–1.2)
Total Protein: 7.2 g/dL (ref 6.0–8.3)

## 2018-11-25 LAB — PSA, MEDICARE: PSA: 1.17 ng/ml (ref 0.10–4.00)

## 2018-11-25 LAB — HEMOGLOBIN A1C: Hgb A1c MFr Bld: 5.4 % (ref 4.6–6.5)

## 2018-11-25 MED ORDER — CYCLOBENZAPRINE HCL 10 MG PO TABS: 10.0000 mg | ORAL_TABLET | Freq: Three times a day (TID) | ORAL | 0 refills | Status: DC | PRN

## 2018-11-25 NOTE — Assessment & Plan Note (Signed)
Drinks 1 or more bottles of wine a night Discussed that he should decrease this

## 2018-11-25 NOTE — Assessment & Plan Note (Signed)
Encourage smoking cessation-he states he has been smoking since he was in the third grade for the most part He knows he should quit, but does not have the motivation or commitment to quit

## 2018-11-25 NOTE — Assessment & Plan Note (Signed)
BP well controlled Current regimen effective and well tolerated Continue current medications at current doses cmp  

## 2018-11-25 NOTE — Assessment & Plan Note (Signed)
Depression is well controlled and currently does not feel depressed He does get some seasonal depression on cloudy days Sertraline working well, but does cause diarrhea Discussed that he can try decreasing the dose to see if that helps and possibly discontinuing it He is retired now and may have an easier time with the cloudy days because he can get out and do more Discussed that we can also try a different medication such as Paxil or Prozac-he will let me know

## 2018-11-25 NOTE — Assessment & Plan Note (Signed)
Taking meloxicam daily CMP, CBC Okay to continue

## 2018-11-25 NOTE — Assessment & Plan Note (Signed)
Intermittent back pain  Takes flexeril prn  Ok to continue  - refilled

## 2018-11-25 NOTE — Assessment & Plan Note (Signed)
Not on medication Discussed that he is at increased risk given his age and risk factors Will check lipid panel, CMP, TSH Will review with him via MyChart his risk, but most likely it will be recommended that he consider starting a statin

## 2018-11-25 NOTE — Assessment & Plan Note (Signed)
We will check A1c Encourage low sugar/carbohydrate diet Continue regular exercise

## 2018-11-26 ENCOUNTER — Encounter: Payer: Self-pay | Admitting: Internal Medicine

## 2019-01-10 ENCOUNTER — Other Ambulatory Visit: Payer: Self-pay | Admitting: Internal Medicine

## 2019-02-09 DIAGNOSIS — R69 Illness, unspecified: Secondary | ICD-10-CM | POA: Diagnosis not present

## 2019-02-17 DIAGNOSIS — Z20828 Contact with and (suspected) exposure to other viral communicable diseases: Secondary | ICD-10-CM | POA: Diagnosis not present

## 2019-02-28 ENCOUNTER — Ambulatory Visit: Payer: Medicare HMO | Attending: Internal Medicine

## 2019-02-28 DIAGNOSIS — Z23 Encounter for immunization: Secondary | ICD-10-CM | POA: Insufficient documentation

## 2019-02-28 NOTE — Progress Notes (Signed)
   Covid-19 Vaccination Clinic  Name:  Rhyson Traughber    MRN: [id number] DOB: March 27, 1950  02/28/2019  Mr. Shalhoub was observed post Covid-19 immunization for 15 minutes without incidence. He was provided with Vaccine Information Sheet and instruction to access the V-Safe system.   Mr. Meng was instructed to call 911 with any severe reactions post vaccine: Marland Kitchen Difficulty breathing  . Swelling of your face and throat  . A fast heartbeat  . A bad rash all over your body  . Dizziness and weakness    Immunizations Administered    Name Date Dose VIS Date Route   Pfizer COVID-19 Vaccine 02/28/2019  3:33 PM 0.3 mL 01/01/2019 Intramuscular   Manufacturer: [place]   Lot: [id number]   [place]: [id number]

## 2019-03-25 ENCOUNTER — Ambulatory Visit: Payer: Medicare HMO | Attending: Internal Medicine

## 2019-03-25 DIAGNOSIS — Z23 Encounter for immunization: Secondary | ICD-10-CM | POA: Insufficient documentation

## 2019-03-25 NOTE — Progress Notes (Signed)
   Covid-19 Vaccination Clinic  Name:  Andrew Woodard    MRN: [id number] DOB: 1950-08-29  03/25/2019  Mr. Bean was observed post Covid-19 immunization for 15 minutes without incident. He was provided with Vaccine Information Sheet and instruction to access the V-Safe system.   [name] was instructed to call 911 with any severe reactions post vaccine: Marland Kitchen Difficulty breathing  . Swelling of face and throat  . A fast heartbeat  . A bad rash all over body  . Dizziness and weakness   Immunizations Administered    Name Date Dose VIS Date Route   Pfizer COVID-19 Vaccine 03/25/2019 10:47 AM 0.3 mL 01/01/2019 Intramuscular   Manufacturer: [place]   Lot: [id number]   [place]: [id number]

## 2019-04-09 ENCOUNTER — Other Ambulatory Visit: Payer: Self-pay | Admitting: Internal Medicine

## 2019-05-24 ENCOUNTER — Other Ambulatory Visit: Payer: Self-pay | Admitting: Internal Medicine

## 2019-05-26 DIAGNOSIS — H0015 Chalazion left lower eyelid: Secondary | ICD-10-CM | POA: Diagnosis not present

## 2019-05-26 DIAGNOSIS — H04123 Dry eye syndrome of bilateral lacrimal glands: Secondary | ICD-10-CM | POA: Diagnosis not present

## 2019-05-26 DIAGNOSIS — H524 Presbyopia: Secondary | ICD-10-CM | POA: Diagnosis not present

## 2019-05-26 DIAGNOSIS — H25813 Combined forms of age-related cataract, bilateral: Secondary | ICD-10-CM | POA: Diagnosis not present

## 2019-06-27 DIAGNOSIS — R69 Illness, unspecified: Secondary | ICD-10-CM | POA: Diagnosis not present

## 2019-06-27 DIAGNOSIS — Z008 Encounter for other general examination: Secondary | ICD-10-CM | POA: Diagnosis not present

## 2019-06-27 DIAGNOSIS — Z6834 Body mass index (BMI) 34.0-34.9, adult: Secondary | ICD-10-CM | POA: Diagnosis not present

## 2019-06-27 DIAGNOSIS — I10 Essential (primary) hypertension: Secondary | ICD-10-CM | POA: Diagnosis not present

## 2019-06-27 DIAGNOSIS — Z791 Long term (current) use of non-steroidal anti-inflammatories (NSAID): Secondary | ICD-10-CM | POA: Diagnosis not present

## 2019-06-27 DIAGNOSIS — E669 Obesity, unspecified: Secondary | ICD-10-CM | POA: Diagnosis not present

## 2019-06-27 DIAGNOSIS — R609 Edema, unspecified: Secondary | ICD-10-CM | POA: Diagnosis not present

## 2019-06-27 DIAGNOSIS — I739 Peripheral vascular disease, unspecified: Secondary | ICD-10-CM | POA: Diagnosis not present

## 2019-06-27 DIAGNOSIS — Z880 Allergy status to penicillin: Secondary | ICD-10-CM | POA: Diagnosis not present

## 2019-07-08 ENCOUNTER — Other Ambulatory Visit: Payer: Self-pay | Admitting: Internal Medicine

## 2019-08-10 DIAGNOSIS — R69 Illness, unspecified: Secondary | ICD-10-CM | POA: Diagnosis not present

## 2019-08-21 ENCOUNTER — Other Ambulatory Visit: Payer: Self-pay | Admitting: Internal Medicine

## 2019-11-23 ENCOUNTER — Ambulatory Visit (INDEPENDENT_AMBULATORY_CARE_PROVIDER_SITE_OTHER): Payer: Medicare HMO

## 2019-11-23 DIAGNOSIS — Z0000 Encounter for general adult medical examination without abnormal findings: Secondary | ICD-10-CM | POA: Diagnosis not present

## 2019-11-23 NOTE — Patient Instructions (Addendum)
Andrew Woodard , Thank you for taking time to come for your Medicare Wellness Visit. I appreciate your ongoing commitment to your health goals. Please review the following plan we discussed and let me know if I can assist you in the future.   Screening recommendations/referrals: Colonoscopy: 06/04/2012; due every 10 years Recommended yearly ophthalmology/optometry visit for glaucoma screening and checkup Recommended yearly dental visit for hygiene and checkup  Vaccinations: Influenza vaccine: 10/14/2018 Pneumococcal vaccine: up to date Tdap vaccine: 05/28/2008; overdue Shingles vaccine: up to date   Covid-19: up to date  Advanced directives: Advance directive discussed with you today. Even though you declined this today please call our office should you change your mind and we can give you the proper paperwork for you to fill out.  Conditions/risks identified: Yes; Reviewed health maintenance screenings with patient today and relevant education, vaccines, and/or referrals were provided. Please continue to do your personal lifestyle choices by: daily care of teeth and gums, regular physical activity (goal should be 5 days a week for 30 minutes), eat a healthy diet, avoid tobacco and drug use, limiting any alcohol intake, taking a low-dose aspirin (if not allergic or have been advised by your provider otherwise) and taking vitamins and minerals as recommended by your provider. Continue doing brain stimulating activities (puzzles, reading, adult coloring books, staying active) to keep memory sharp. Continue to eat heart healthy diet (full of fruits, vegetables, whole grains, lean protein, water--limit salt, fat, and sugar intake) and increase physical activity as tolerated.  Next appointment: Please schedule your next Medicare Wellness Visit with your Nurse Health Advisor in 1 year.  Preventive Care 25 Years and Older, Male Preventive care refers to lifestyle choices and visits with your health care  provider that can promote health and wellness. What does preventive care include?  A yearly physical exam. This is also called an annual well check.  Dental exams once or twice a year.  Routine eye exams. Ask your health care provider how often you should have your eyes checked.  Personal lifestyle choices, including:  Daily care of your teeth and gums.  Regular physical activity.  Eating a healthy diet.  Avoiding tobacco and drug use.  Limiting alcohol use.  Practicing safe sex.  Taking low doses of aspirin every day.  Taking vitamin and mineral supplements as recommended by your health care provider. What happens during an annual well check? The services and screenings done by your health care provider during your annual well check will depend on your age, overall health, lifestyle risk factors, and family history of disease. Counseling  Your health care provider may ask you questions about your:  Alcohol use.  Tobacco use.  Drug use.  Emotional well-being.  Home and relationship well-being.  Sexual activity.  Eating habits.  History of falls.  Memory and ability to understand (cognition).  Work and work Statistician. Screening  You may have the following tests or measurements:  Height, weight, and BMI.  Blood pressure.  Lipid and cholesterol levels. These may be checked every 5 years, or more frequently if you are over 21 years old.  Skin check.  Lung cancer screening. You may have this screening every year starting at age 75 if you have a 30-pack-year history of smoking and currently smoke or have quit within the past 15 years.  Fecal occult blood test (FOBT) of the stool. You may have this test every year starting at age 73.  Flexible sigmoidoscopy or colonoscopy. You may have a sigmoidoscopy  every 5 years or a colonoscopy every 10 years starting at age 72.  Prostate cancer screening. Recommendations will vary depending on your family history and  other risks.  Hepatitis C blood test.  Hepatitis B blood test.  Sexually transmitted disease (STD) testing.  Diabetes screening. This is done by checking your blood sugar (glucose) after you have not eaten for a while (fasting). You may have this done every 1-3 years.  Abdominal aortic aneurysm (AAA) screening. You may need this if you are a current or former smoker.  Osteoporosis. You may be screened starting at age 34 if you are at high risk. Talk with your health care provider about your test results, treatment options, and if necessary, the need for more tests. Vaccines  Your health care provider may recommend certain vaccines, such as:  Influenza vaccine. This is recommended every year.  Tetanus, diphtheria, and acellular pertussis (Tdap, Td) vaccine. You may need a Td booster every 10 years.  Zoster vaccine. You may need this after age 7.  Pneumococcal 13-valent conjugate (PCV13) vaccine. One dose is recommended after age 45.  Pneumococcal polysaccharide (PPSV23) vaccine. One dose is recommended after age 54. Talk to your health care provider about which screenings and vaccines you need and how often you need them. This information is not intended to replace advice given to you by your health care provider. Make sure you discuss any questions you have with your health care provider. Document Released: 02/03/2015 Document Revised: 09/27/2015 Document Reviewed: 11/08/2014 Elsevier Interactive Patient Education  2017 [employer] Prevention in the Home Falls can cause injuries. They can happen to people of all ages. There are many things you can do to make your home safe and to help prevent falls. What can I do on the outside of my home?  Regularly fix the edges of walkways and driveways and fix any cracks.  Remove anything that might make you trip as you walk through a door, such as a raised step or threshold.  Trim any bushes or trees on the path to your  home.  Use bright outdoor lighting.  Clear any walking paths of anything that might make someone trip, such as rocks or tools.  Regularly check to see if handrails are loose or broken. Make sure that both sides of any steps have handrails.  Any raised decks and porches should have guardrails on the edges.  Have any leaves, snow, or ice cleared regularly.  Use sand or salt on walking paths during winter.  Clean up any spills in your garage right away. This includes oil or grease spills. What can I do in the bathroom?  Use night lights.  Install grab bars by the toilet and in the tub and shower. Do not use towel bars as grab bars.  Use non-skid mats or decals in the tub or shower.  If you need to sit down in the shower, use a plastic, non-slip stool.  Keep the floor dry. Clean up any water that spills on the floor as soon as it happens.  Remove soap buildup in the tub or shower regularly.  Attach bath mats securely with double-sided non-slip rug tape.  Do not have throw rugs and other things on the floor that can make you trip. What can I do in the bedroom?  Use night lights.  Make sure that you have a light by your bed that is easy to reach.  Do not use any sheets or blankets that are too  big for your bed. They should not hang down onto the floor.  Have a firm chair that has side arms. You can use this for support while you get dressed.  Do not have throw rugs and other things on the floor that can make you trip. What can I do in the kitchen?  Clean up any spills right away.  Avoid walking on wet floors.  Keep items that you use a lot in easy-to-reach places.  If you need to reach something above you, use a strong step stool that has a grab bar.  Keep electrical cords out of the way.  Do not use floor polish or wax that makes floors slippery. If you must use wax, use non-skid floor wax.  Do not have throw rugs and other things on the floor that can make you  trip. What can I do with my stairs?  Do not leave any items on the stairs.  Make sure that there are handrails on both sides of the stairs and use them. Fix handrails that are broken or loose. Make sure that handrails are as long as the stairways.  Check any carpeting to make sure that it is firmly attached to the stairs. Fix any carpet that is loose or worn.  Avoid having throw rugs at the top or bottom of the stairs. If you do have throw rugs, attach them to the floor with carpet tape.  Make sure that you have a light switch at the top of the stairs and the bottom of the stairs. If you do not have them, ask someone to add them for you. What else can I do to help prevent falls?  Wear shoes that:  Do not have high heels.  Have rubber bottoms.  Are comfortable and fit you well.  Are closed at the toe. Do not wear sandals.  If you use a stepladder:  Make sure that it is fully opened. Do not climb a closed stepladder.  Make sure that both sides of the stepladder are locked into place.  Ask someone to hold it for you, if possible.  Clearly mark and make sure that you can see:  Any grab bars or handrails.  First and last steps.  Where the edge of each step is.  Use tools that help you move around (mobility aids) if they are needed. These include:  Canes.  Walkers.  Scooters.  Crutches.  Turn on the lights when you go into a dark area. Replace any light bulbs as soon as they burn out.  Set up your furniture so you have a clear path. Avoid moving your furniture around.  If any of your floors are uneven, fix them.  If there are any pets around you, be aware of where they are.  Review your medicines with your doctor. Some medicines can make you feel dizzy. This can increase your chance of falling. Ask your doctor what other things that you can do to help prevent falls. This information is not intended to replace advice given to you by your health care provider. Make  sure you discuss any questions you have with your health care provider. Document Released: 11/03/2008 Document Revised: 06/15/2015 Document Reviewed: 02/11/2014 Elsevier Interactive Patient Education  2017 [employer].

## 2019-11-23 NOTE — Progress Notes (Signed)
I connected with Andrew Woodard today by telephone and verified that I am speaking with the correct person using two identifiers. Location patient: home Location provider: work Persons participating in the virtual visit: Andrew Woodard and Andrew Abu, LPN.   I discussed the limitations, risks, security and privacy concerns of performing an evaluation and management service by telephone and the availability of in person appointments. I also discussed with the patient that there may be a patient responsible charge related to this service. The patient expressed understanding and verbally consented to this telephonic visit.    Interactive audio and video telecommunications were attempted between this provider and patient, however failed, due to patient having technical difficulties OR patient did not have access to video capability.  We continued and completed visit with audio only.  Some vital signs may be absent or patient reported.   Time Spent with patient on telephone encounter: 30 minutes  Subjective:   Andrew Woodard is a 69 y.o. male who presents for Medicare Annual/Subsequent preventive examination.  Review of Systems    NO ROS. Medicare Wellness Visit. Cardiac Risk Factors include: advanced age (>69men, >67 women);dyslipidemia;family history of premature cardiovascular disease;hypertension;male gender Sleep Patterns: No sleep issues, feels rested on waking and sleeps 6-8 hours nightly. Home Safety/Smoke Alarms: Feels safe in home; uses home alarm. Smoke alarms in place. Living environment: 2-story home; Lives with spouse; no needs for DME; good support system. Seat Belt Safety/Bike Helmet: Wears seat belt.    Objective:    There were no vitals filed for this visit. There is no height or weight on file to calculate BMI.  Advanced Directives 11/23/2019  Does Patient Have a Medical Advance Directive? No  Would patient like information on creating a medical advance directive?  No - Patient declined    Current Medications (verified) Outpatient Encounter Medications as of 11/23/2019  Medication Sig  . fluocinolone (VANOS) 0.01 % cream Apply topically 2 (two) times daily.  . hydrochlorothiazide (HYDRODIURIL) 25 MG tablet Take 1/2 (one-half) tablet by mouth once daily  . lisinopril (ZESTRIL) 40 MG tablet Take 1 tablet by mouth once daily  . meloxicam (MOBIC) 15 MG tablet Take 1 tablet by mouth once daily  . metoprolol succinate (TOPROL-XL) 50 MG 24 hr tablet TAKE 1 TABLET BY MOUTH ONCE DAILY. TAKE WITH OR IMMEDIATELY FOLLOWING A MEAL  . sertraline (ZOLOFT) 100 MG tablet Take 1 tablet by mouth once daily  . cyclobenzaprine (FLEXERIL) 10 MG tablet Take 1 tablet (10 mg total) by mouth 3 (three) times daily as needed for muscle spasms. (Patient not taking: Reported on 11/23/2019)   No facility-administered encounter medications on file as of 11/23/2019.    Allergies (verified) Penicillins and Wellbutrin [bupropion]   History: Past Medical History:  Diagnosis Date  . ADD (attention deficit disorder)   . Allergy   . Arthritis   . Depression   . Fasting hyperglycemia   . Hyperlipidemia   . Hypertension    Past Surgical History:  Procedure Laterality Date  . ABSCESS DRAINAGE  2012   L thigh, Dr Excell Seltzer (not MRSA)  . COLONOSCOPY  2014  . colonoscopy with polypectomy  2007   Dr Henrene Pastor  . CYSTECTOMY  2011   L knee, Dr French Ana  . ESI  2013    Dr Ernestina Patches  . ganglionectomy    . PILONIDAL CYST EXCISION     X 2  . VASECTOMY     X 2; Dr Karsten Ro   Family History  Problem  Relation Age of Onset  . Alzheimer's disease Mother   . Depression Father   . Kidney disease Father        ESRD  . Colon cancer Maternal Grandmother 35  . Heart attack Paternal Grandfather 76  . Heart attack Maternal Grandfather 76        pacer  . Stroke Neg Hx   . Diabetes Neg Hx   . Esophageal cancer Neg Hx   . Rectal cancer Neg Hx   . Stomach cancer Neg Hx    Social History    Socioeconomic History  . Marital status: Married    Spouse name: Not on file  . Number of children: Not on file  . Years of education: Not on file  . Highest education level: Not on file  Occupational History  . Not on file  Tobacco Use  . Smoking status: Current Every Day Smoker    Packs/day: 1.00    Types: Cigarettes  . Smokeless tobacco: Never Used  . Tobacco comment: smokedage18- present , up to 2 ppd for 10 years. 08/09/14 6-7 cigarettes per day.  Substance and Sexual Activity  . Alcohol use: Yes    Comment: 28-32  glasses/ week of wine  . Drug use: No  . Sexual activity: Not on file  Other Topics Concern  . Not on file  Social History Narrative   Walking regularly for exercise   Plays golf   Social Determinants of Health   Financial Resource Strain: Low Risk   . Difficulty of Paying Living Expenses: Not hard at all  Food Insecurity: No Food Insecurity  . Worried About Charity fundraiser in the Last Year: Never true  . Ran Out of Food in the Last Year: Never true  Transportation Needs: No Transportation Needs  . Lack of Transportation (Medical): No  . Lack of Transportation (Non-Medical): No  Physical Activity: Sufficiently Active  . Days of Exercise per Week: 5 days  . Minutes of Exercise per Session: 30 min  Stress: No Stress Concern Present  . Feeling of Stress : Not at all  Social Connections: Socially Integrated  . Frequency of Communication with Friends and Family: More than three times a week  . Frequency of Social Gatherings with Friends and Family: More than three times a week  . Attends Religious Services: More than 4 times per year  . Active Member of Clubs or Organizations: Yes  . Attends Archivist Meetings: More than 4 times per year  . Marital Status: Married    Tobacco Counseling Ready to quit: Not Answered Counseling given: Not Answered Comment: smokedage18- present , up to 2 ppd for 10 years. 08/09/14 6-7 cigarettes per  day.   Clinical Intake:  Pre-visit preparation completed: Yes  Pain : No/denies pain     Nutritional Risks: Nausea/ vomitting/ diarrhea (Possibly due to taking zoloft) Diabetes: No  How often do you need to have someone help you when you read instructions, pamphlets, or other written materials from your doctor or pharmacy?: 1 - Never What is the last grade level you completed in school?: Bachelor's Degree  Diabetic? no  Interpreter Needed?: No  Information entered by :: Andrew Abu, LPN   Activities of Daily Living In your present state of health, do you have any difficulty performing the following activities: 11/23/2019  Hearing? N  Vision? N  Difficulty concentrating or making decisions? N  Walking or climbing stairs? N  Dressing or bathing? N  Doing errands, shopping?  N  Preparing Food and eating ? N  Using the Toilet? N  In the past six months, have you accidently leaked urine? N  Do you have problems with loss of bowel control? N  Managing your Medications? N  Managing your Finances? N  Housekeeping or managing your Housekeeping? N  Some recent data might be hidden    Patient Care Team: Binnie Rail, MD as PCP - General (Internal Medicine)  Indicate any recent Medical Services you may have received from other than [facility] providers in the past year (date may be approximate).     Assessment:   This is a routine wellness examination for [place].  Hearing/Vision screen No exam data present  Dietary issues and exercise activities discussed: Current Exercise Habits: Home exercise routine, Type of exercise: walking (golf, walks dog 1-2 miles everyday, yard work, goal: 7,000-10,000 steps per day), Time (Minutes): 30, Frequency (Times/Week): 7, Weekly Exercise (Minutes/Week): 210, Intensity: Moderate, Exercise limited by: None identified  Goals    . Quit Smoking      Depression Screen PHQ 2/9 Scores 11/23/2019 04/09/2017  PHQ - 2 Score 0 0    Fall  Risk Fall Risk  11/23/2019 04/09/2017  Falls in the past year? 0 No  Number falls in past yr: 0 -  Injury with Fall? 0 -  Risk for fall due to : No Fall Risks -  Follow up Falls evaluation completed -    Any stairs in or around the home? Yes  If so, are there any without handrails? No  Home free of loose throw rugs in walkways, pet beds, electrical cords, etc? Yes  Adequate lighting in your home to reduce risk of falls? Yes   ASSISTIVE DEVICES UTILIZED TO PREVENT FALLS:  Life alert? No  Use of a cane, walker or w/c? No  Grab bars in the bathroom? No  Shower chair or bench in shower? No  Elevated toilet seat or a handicapped toilet? No   TIMED UP AND GO:  Was the test performed? No .  Length of time to ambulate 10 feet: 0 sec.   Gait steady and fast without use of assistive device  Cognitive Function: Patient is cogitatively intact.        Immunizations Immunization History  Administered Date(s) Administered  . Fluad Quad(high Dose 65+) 10/14/2018  . Influenza Whole 10/22/2011  . Influenza, High Dose Seasonal PF 02/09/2018  . Influenza,inj,Quad PF,6+ Mos 01/05/2015  . Influenza-Unspecified 10/21/2016  . PFIZER SARS-COV-2 Vaccination 02/28/2019, 03/25/2019  . Pneumococcal Conjugate-13 10/14/2018  . Pneumococcal-Unspecified 10/22/2011  . Td 06/07/2008  . Zoster Recombinat (Shingrix) 10/17/2017    TDAP status: Due, Education has been provided regarding the importance of this vaccine. Advised may receive this vaccine at local pharmacy or Health Dept. Aware to provide a copy of the vaccination record if obtained from local pharmacy or Health Dept. Verbalized acceptance and understanding. Flu Vaccine status: Up to date Pneumococcal vaccine status: Up to date Covid-19 vaccine status: Completed vaccines  Qualifies for Shingles Vaccine? Yes   Zostavax completed Yes   Shingrix Completed?: Yes  Screening Tests Health Maintenance  Topic Date Due  . INFLUENZA VACCINE   08/22/2019  . PNA vac Low Risk Adult (2 of 2 - PPSV23) 10/14/2019  . TETANUS/TDAP  11/25/2019 (Originally 06/08/2018)  . COLONOSCOPY  06/05/2022  . COVID-19 Vaccine  Completed  . Hepatitis C Screening  Completed    Health Maintenance  Health Maintenance Due  Topic Date Due  . INFLUENZA  VACCINE  08/22/2019  . PNA vac Low Risk Adult (2 of 2 - PPSV23) 10/14/2019    Colorectal cancer screening: Completed 06/04/2012. Repeat every 10 years  Lung Cancer Screening: (Low Dose CT Chest recommended if Age 66-80 years, 30 pack-year currently smoking OR have quit w/in 15years.) does qualify.   Lung Cancer Screening Referral: no; provider would have to refer patient.  Additional Screening:  Hepatitis C Screening: does qualify; Completed yes  Vision Screening: Recommended annual ophthalmology exams for early detection of glaucoma and other disorders of the eye. Is the patient up to date with their annual eye exam?  Yes  Who is the provider or what is the name of the office in which the patient attends annual eye exams? Shon Hough, MD If pt is not established with a provider, would they like to be referred to a provider to establish care? No .   Dental Screening: Recommended annual dental exams for proper oral hygiene  Community Resource Referral / Chronic Care Management: CRR required this visit?  No   CCM required this visit?  No      Plan:     I have personally reviewed and noted the following in the patient's chart:   . Medical and social history . Use of alcohol, tobacco or illicit drugs  . Current medications and supplements . Functional ability and status . Nutritional status . Physical activity . Advanced directives . List of other physicians . Hospitalizations, surgeries, and ER visits in previous 12 months . Vitals . Screenings to include cognitive, depression, and falls . Referrals and appointments  In addition, I have reviewed and discussed with patient  certain preventive protocols, quality metrics, and best practice recommendations. A written personalized care plan for preventive services as well as general preventive health recommendations were provided to patient.     Sheral Flow, LPN   [id number]   Nurse Notes:  Patient is cogitatively intact. There were no vitals filed for this visit. There is no height or weight on file to calculate BMI. Patient stated that he has no issues with gait or balance; does not use any assistive devices.

## 2019-11-25 NOTE — Patient Instructions (Addendum)
Consider getting the tetanus and pneumovax vaccines.    Blood work was ordered.    All other Health Maintenance issues reviewed.   All recommended immunizations and age-appropriate screenings are up-to-date or discussed.  Flu immunization administered today.   Medications reviewed and updated.  Changes include :   none    Please followup in 1 year    Health Maintenance, Male Adopting a healthy lifestyle and getting preventive care are important in promoting health and wellness. Ask your health care provider about:  The right schedule for you to have regular tests and exams.  Things you can do on your own to prevent diseases and keep yourself healthy. What should I know about diet, weight, and exercise? Eat a healthy diet   Eat a diet that includes plenty of vegetables, fruits, low-fat dairy products, and lean protein.  Do not eat a lot of foods that are high in solid fats, added sugars, or sodium. Maintain a healthy weight Body mass index (BMI) is a measurement that can be used to identify possible weight problems. It estimates body fat based on height and weight. Your health care provider can help determine your BMI and help you achieve or maintain a healthy weight. Get regular exercise Get regular exercise. This is one of the most important things you can do for your health. Most adults should:  Exercise for at least 150 minutes each week. The exercise should increase your heart rate and make you sweat (moderate-intensity exercise).  Do strengthening exercises at least twice a week. This is in addition to the moderate-intensity exercise.  Spend less time sitting. Even light physical activity can be beneficial. Watch cholesterol and blood lipids Have your blood tested for lipids and cholesterol at 67 years of age, then have this test every 5 years. You may need to have your cholesterol levels checked more often if:  Your lipid or cholesterol levels are high.  You are  older than 67 years of age.  You are at high risk for heart disease. What should I know about cancer screening? Many types of cancers can be detected early and may often be prevented. Depending on your health history and family history, you may need to have cancer screening at various ages. This may include screening for:  Colorectal cancer.  Prostate cancer.  Skin cancer.  Lung cancer. What should I know about heart disease, diabetes, and high blood pressure? Blood pressure and heart disease  High blood pressure causes heart disease and increases the risk of stroke. This is more likely to develop in people who have high blood pressure readings, are of African descent, or are overweight.  Talk with your health care provider about your target blood pressure readings.  Have your blood pressure checked: ? Every 3-5 years if you are 42-58 years of age. ? Every year if you are 49 years old or older.  If you are between the ages of 61 and 49 and are a current or former smoker, ask your health care provider if you should have a one-time screening for abdominal aortic aneurysm (AAA). Diabetes Have regular diabetes screenings. This checks your fasting blood sugar level. Have the screening done:  Once every three years after age 69 if you are at a normal weight and have a low risk for diabetes.  More often and at a younger age if you are overweight or have a high risk for diabetes. What should I know about preventing infection? Hepatitis B If you have a  higher risk for hepatitis B, you should be screened for this virus. Talk with your health care provider to find out if you are at risk for hepatitis B infection. Hepatitis C Blood testing is recommended for:  Everyone born from [birth year] through 1963.  Anyone with known risk factors for hepatitis C. Sexually transmitted infections (STIs)  You should be screened each year for STIs, including gonorrhea and chlamydia, if: ? You are sexually  active and are younger than 67 years of age. ? You are older than 67 years of age and your health care provider tells you that you are at risk for this type of infection. ? Your sexual activity has changed since you were last screened, and you are at increased risk for chlamydia or gonorrhea. Ask your health care provider if you are at risk.  Ask your health care provider about whether you are at high risk for HIV. Your health care provider may recommend a prescription medicine to help prevent HIV infection. If you choose to take medicine to prevent HIV, you should first get tested for HIV. You should then be tested every 3 months for as long as you are taking the medicine. Follow these instructions at home: Lifestyle  Do not use any products that contain nicotine or tobacco, such as cigarettes, e-cigarettes, and chewing tobacco. If you need help quitting, ask your health care provider.  Do not use street drugs.  Do not share needles.  Ask your health care provider for help if you need support or information about quitting drugs. Alcohol use  Do not drink alcohol if your health care provider tells you not to drink.  If you drink alcohol: ? Limit how much you have to 0-2 drinks a day. ? Be aware of how much alcohol is in your drink. In the [place] one drink equals one 12 oz bottle of beer (355 mL), one 5 oz glass of wine (148 mL), or one 1 oz glass of hard liquor (44 mL). General instructions  Schedule regular health, dental, and eye exams.  Stay current with your vaccines.  Tell your health care provider if: ? You often feel depressed. ? You have ever been abused or do not feel safe at home. Summary  Adopting a healthy lifestyle and getting preventive care are important in promoting health and wellness.  Follow your health care provider's instructions about healthy diet, exercising, and getting tested or screened for diseases.  Follow your health care provider's instructions on  monitoring your cholesterol and blood pressure. This information is not intended to replace advice given to you by your health care provider. Make sure you discuss any questions you have with your health care provider. Document Revised: 12/31/2017 Document Reviewed: 12/31/2017 Elsevier Patient Education  2020 [employer].

## 2019-11-25 NOTE — Progress Notes (Signed)
Subjective:    Patient ID: Andrew Woodard, male    DOB: 12-07-1950, 69 y.o.   MRN: [id number]  HPI He is here for a physical exam.   He has diarrhea daily for 15 years. It is multiple times a day.  Sometimes it is harder to control.  It is only been harder to control recently.  Prior to that it has not really bothered him.  He has recently had a little discomfort around his umbilical area.  His sister has the same thing and she advised that he try IBgard and he just started taking it.  He denies any blood in the stool or mucus in the stool.  He denies any relation to certain foods.     Medications and allergies reviewed with patient and updated if appropriate.  Patient Active Problem List   Diagnosis Date Noted  . Hyperglycemia 04/09/2017  . Osteoarthritis 04/09/2017  . Alcohol abuse 04/26/2016  . Depression 04/06/2015  . Nonspecific abnormal electrocardiogram (ECG) (EKG) 04/13/2012  . LOW BACK PAIN SYNDROME 10/09/2009  . COLONIC POLYPS, HX OF 06/07/2008  . LIPOMAS, MULTIPLE 01/26/2008  . HYPERLIPIDEMIA 01/01/2007  . DYSMETABOLIC SYNDROME X 40/98/1191  . Tobacco abuse 01/01/2007  . Essential hypertension 01/01/2007  . ADD 06/11/2006    Current Outpatient Medications on File Prior to Visit  Medication Sig Dispense Refill  . fluocinolone (VANOS) 0.01 % cream Apply topically 2 (two) times daily. 60 g 1  . hydrochlorothiazide (HYDRODIURIL) 25 MG tablet Take 1/2 (one-half) tablet by mouth once daily 45 tablet 0  . lisinopril (ZESTRIL) 40 MG tablet Take 1 tablet by mouth once daily 90 tablet 1  . meloxicam (MOBIC) 15 MG tablet Take 1 tablet by mouth once daily 90 tablet 0  . metoprolol succinate (TOPROL-XL) 50 MG 24 hr tablet TAKE 1 TABLET BY MOUTH ONCE DAILY. TAKE WITH OR IMMEDIATELY FOLLOWING A MEAL 90 tablet 1  . sertraline (ZOLOFT) 100 MG tablet Take 1 tablet by mouth once daily 90 tablet 1   No current facility-administered medications on file prior to visit.    Past Medical  History:  Diagnosis Date  . ADD (attention deficit disorder)   . Allergy   . Arthritis   . Depression   . Fasting hyperglycemia   . Hyperlipidemia   . Hypertension     Past Surgical History:  Procedure Laterality Date  . ABSCESS DRAINAGE  2012   L thigh, Dr Excell Seltzer (not MRSA)  . COLONOSCOPY  2014  . colonoscopy with polypectomy  2007   Dr Henrene Pastor  . CYSTECTOMY  2011   L knee, Dr French Ana  . ESI  2013    Dr Ernestina Patches  . ganglionectomy    . PILONIDAL CYST EXCISION     X 2  . VASECTOMY     X 2; Dr Karsten Ro    Social History   Socioeconomic History  . Marital status: Married    Spouse name: Not on file  . Number of children: Not on file  . Years of education: Not on file  . Highest education level: Not on file  Occupational History  . Not on file  Tobacco Use  . Smoking status: Current Every Day Smoker    Packs/day: 1.00    Types: Cigarettes  . Smokeless tobacco: Never Used  . Tobacco comment: smokedage18- present , up to 2 ppd for 10 years. 08/09/14 6-7 cigarettes per day.  Substance and Sexual Activity  . Alcohol use: Yes    Comment:  28-32  glasses/ week of wine  . Drug use: No  . Sexual activity: Not on file  Other Topics Concern  . Not on file  Social History Narrative   Walking regularly for exercise   Plays golf   Social Determinants of Health   Financial Resource Strain: Low Risk   . Difficulty of Paying Living Expenses: Not hard at all  Food Insecurity: No Food Insecurity  . Worried About Charity fundraiser in the Last Year: Never true  . Ran Out of Food in the Last Year: Never true  Transportation Needs: No Transportation Needs  . Lack of Transportation (Medical): No  . Lack of Transportation (Non-Medical): No  Physical Activity: Sufficiently Active  . Days of Exercise per Week: 5 days  . Minutes of Exercise per Session: 30 min  Stress: No Stress Concern Present  . Feeling of Stress : Not at all  Social Connections: Socially Integrated  .  Frequency of Communication with Friends and Family: More than three times a week  . Frequency of Social Gatherings with Friends and Family: More than three times a week  . Attends Religious Services: More than 4 times per year  . Active Member of Clubs or Organizations: Yes  . Attends Archivist Meetings: More than 4 times per year  . Marital Status: Married    Family History  Problem Relation Age of Onset  . Alzheimer's disease Mother   . Depression Father   . Kidney disease Father        ESRD  . Colon cancer Maternal Grandmother 3  . Heart attack Paternal Grandfather 76  . Heart attack Maternal Grandfather 76        pacer  . Stroke Neg Hx   . Diabetes Neg Hx   . Esophageal cancer Neg Hx   . Rectal cancer Neg Hx   . Stomach cancer Neg Hx     Review of Systems  Constitutional: Negative for chills and fever.  Eyes: Negative for visual disturbance.  Respiratory: Positive for cough (daily, dry - ? related to lisinopril - some sputum) and wheezing (occ when sleeping). Negative for shortness of breath.   Cardiovascular: Negative for chest pain, palpitations and leg swelling.  Gastrointestinal: Positive for abdominal pain (occ umbilical - tums helps) and diarrhea (no mucus). Negative for blood in stool and constipation.       Rare gerd  Genitourinary: Negative for difficulty urinating, dysuria and hematuria.  Musculoskeletal: Positive for arthralgias and neck pain (occ - wtih probably pinched nerve down right arm).  Skin: Negative for color change and rash.  Neurological: Positive for dizziness (occ with getting up quick). Negative for headaches.  Psychiatric/Behavioral: Negative for dysphoric mood. The patient is not nervous/anxious.        Objective:   Vitals:   11/26/19 1113  BP: 122/80  Pulse: 75  Temp: 98 F (36.7 C)  SpO2: 96%   Filed Weights   11/26/19 1113  Weight: 239 lb 6.4 oz (108.6 kg)   Body mass index is 34.35 kg/m.  BP Readings from Last 3  Encounters:  11/26/19 122/80  11/25/18 122/74  04/09/17 132/76    Wt Readings from Last 3 Encounters:  11/26/19 239 lb 6.4 oz (108.6 kg)  11/25/18 240 lb 12.8 oz (109.2 kg)  04/09/17 249 lb (112.9 kg)     Physical Exam Constitutional: He appears well-developed and well-nourished. No distress.  HENT:  Head: Normocephalic and atraumatic.  Right Ear: External ear normal.  Left Ear: External ear normal.  Mouth/Throat: Oropharynx is clear and moist.  Normal ear canals and TM b/l  Eyes: Conjunctivae and EOM are normal.  Neck: Neck supple. No tracheal deviation present. No thyromegaly present.  No carotid bruit  Cardiovascular: Normal rate, regular rhythm, normal heart sounds and intact distal pulses.   No murmur heard. Pulmonary/Chest: Effort normal and breath sounds normal. No respiratory distress. He has no wheezes. He has no rales.  Abdominal: Soft. Obesity. Ventral hernia - non tender. He exhibits no distension. There is no tenderness.  Genitourinary: deferred  Musculoskeletal: He exhibits no edema.  Lymphadenopathy:   He has no cervical adenopathy.  Skin: Skin is warm and dry. He is not diaphoretic.  Psychiatric: He has a normal mood and affect. His behavior is normal.         Assessment & Plan:   Physical exam: Screening blood work  ordered Immunizations  PPSV23 deferred, flu vac today, tdap deferred Colonoscopy   Up to date  Eye exams   Up to date  Exercise   Golf, yard work, walks puppy - 1-1.5 miles a day Weight  Advised weight loss Substance abuse   Smoking, etoh abuse -encouraged decreasing alcohol and stopping smoking.  See Problem List for Assessment and Plan of chronic medical problems.   This visit occurred during the SARS-CoV-2 public health emergency.  Safety protocols were in place, including screening questions prior to the visit, additional usage of staff PPE, and extensive cleaning of exam room while observing appropriate contact time as indicated for  disinfecting solutions.

## 2019-11-26 ENCOUNTER — Encounter: Payer: Self-pay | Admitting: Internal Medicine

## 2019-11-26 ENCOUNTER — Other Ambulatory Visit: Payer: Self-pay | Admitting: Internal Medicine

## 2019-11-26 ENCOUNTER — Other Ambulatory Visit: Payer: Self-pay

## 2019-11-26 ENCOUNTER — Ambulatory Visit (INDEPENDENT_AMBULATORY_CARE_PROVIDER_SITE_OTHER): Payer: Medicare HMO | Admitting: Internal Medicine

## 2019-11-26 VITALS — BP 122/80 | HR 75 | Temp 98.0°F | Ht 70.0 in | Wt 239.4 lb

## 2019-11-26 DIAGNOSIS — R739 Hyperglycemia, unspecified: Secondary | ICD-10-CM | POA: Diagnosis not present

## 2019-11-26 DIAGNOSIS — Z0000 Encounter for general adult medical examination without abnormal findings: Secondary | ICD-10-CM

## 2019-11-26 DIAGNOSIS — E782 Mixed hyperlipidemia: Secondary | ICD-10-CM | POA: Diagnosis not present

## 2019-11-26 DIAGNOSIS — F1010 Alcohol abuse, uncomplicated: Secondary | ICD-10-CM

## 2019-11-26 DIAGNOSIS — Z125 Encounter for screening for malignant neoplasm of prostate: Secondary | ICD-10-CM | POA: Diagnosis not present

## 2019-11-26 DIAGNOSIS — Z23 Encounter for immunization: Secondary | ICD-10-CM | POA: Diagnosis not present

## 2019-11-26 DIAGNOSIS — Z720 Tobacco use: Secondary | ICD-10-CM

## 2019-11-26 DIAGNOSIS — K529 Noninfective gastroenteritis and colitis, unspecified: Secondary | ICD-10-CM | POA: Diagnosis not present

## 2019-11-26 DIAGNOSIS — F3289 Other specified depressive episodes: Secondary | ICD-10-CM

## 2019-11-26 DIAGNOSIS — M8949 Other hypertrophic osteoarthropathy, multiple sites: Secondary | ICD-10-CM | POA: Diagnosis not present

## 2019-11-26 DIAGNOSIS — I10 Essential (primary) hypertension: Secondary | ICD-10-CM

## 2019-11-26 DIAGNOSIS — M159 Polyosteoarthritis, unspecified: Secondary | ICD-10-CM

## 2019-11-26 DIAGNOSIS — R69 Illness, unspecified: Secondary | ICD-10-CM | POA: Diagnosis not present

## 2019-11-26 MED ORDER — CYCLOBENZAPRINE HCL 10 MG PO TABS: 10.0000 mg | ORAL_TABLET | Freq: Three times a day (TID) | ORAL | 0 refills | Status: DC | PRN

## 2019-11-26 NOTE — Assessment & Plan Note (Signed)
Chronic Has been smoking for decades He knows he should quit and at times wants to quit, but at this time is not committed to trying to quit

## 2019-11-26 NOTE — Assessment & Plan Note (Signed)
Chronic Check a1c Low sugar / carb diet Stressed regular exercise  

## 2019-11-26 NOTE — Assessment & Plan Note (Signed)
Chronic Drinks only small bottle of wine a night Advised that he should decrease this amount to limit to 2 glasses a night

## 2019-11-26 NOTE — Assessment & Plan Note (Signed)
Chronic BP well controlled Continue hctz 12.5 mg, lisinopril 40 mg daily, metoprolol XL 50 mg daily cmp

## 2019-11-26 NOTE — Assessment & Plan Note (Signed)
Chronic Takes meloxicam 30 mg daily, which is very effective We will continue

## 2019-11-26 NOTE — Assessment & Plan Note (Signed)
Chronic Started about 15 years ago Has not bothered him until recently when he stopped having control of the diarrhea and had an accident Unlikely infectious given duration Celiac panel Colonoscopy up to date Will try iBgard Discussed seeing GI-he deferred for now and will let me know if the IBgard does not work.

## 2019-11-26 NOTE — Assessment & Plan Note (Signed)
Chronic Controlled, stable Continue  sertraline 100 mg daily 

## 2019-11-26 NOTE — Assessment & Plan Note (Addendum)
Chronic Check lipid panel, CMP, TSH Lifestyle controlled Regular exercise and healthy diet encouraged

## 2019-12-02 DIAGNOSIS — Z720 Tobacco use: Secondary | ICD-10-CM | POA: Diagnosis not present

## 2019-12-02 DIAGNOSIS — Z23 Encounter for immunization: Secondary | ICD-10-CM

## 2019-12-02 DIAGNOSIS — R69 Illness, unspecified: Secondary | ICD-10-CM | POA: Diagnosis not present

## 2019-12-02 DIAGNOSIS — R739 Hyperglycemia, unspecified: Secondary | ICD-10-CM | POA: Diagnosis not present

## 2019-12-02 DIAGNOSIS — Z125 Encounter for screening for malignant neoplasm of prostate: Secondary | ICD-10-CM | POA: Diagnosis not present

## 2019-12-02 DIAGNOSIS — Z0000 Encounter for general adult medical examination without abnormal findings: Secondary | ICD-10-CM | POA: Diagnosis not present

## 2019-12-02 DIAGNOSIS — I10 Essential (primary) hypertension: Secondary | ICD-10-CM | POA: Diagnosis not present

## 2019-12-02 DIAGNOSIS — M8949 Other hypertrophic osteoarthropathy, multiple sites: Secondary | ICD-10-CM | POA: Diagnosis not present

## 2019-12-02 DIAGNOSIS — E782 Mixed hyperlipidemia: Secondary | ICD-10-CM | POA: Diagnosis not present

## 2019-12-02 NOTE — Addendum Note (Signed)
Addended by: Marcina Millard on: 12/02/2019 08:26 AM   Modules accepted: Orders

## 2020-01-03 ENCOUNTER — Other Ambulatory Visit: Payer: Self-pay | Admitting: Internal Medicine

## 2020-02-21 ENCOUNTER — Other Ambulatory Visit: Payer: Self-pay | Admitting: Internal Medicine

## 2020-04-01 ENCOUNTER — Other Ambulatory Visit: Payer: Self-pay | Admitting: Internal Medicine

## 2020-05-18 ENCOUNTER — Other Ambulatory Visit: Payer: Self-pay | Admitting: Internal Medicine

## 2020-05-29 DIAGNOSIS — H04123 Dry eye syndrome of bilateral lacrimal glands: Secondary | ICD-10-CM | POA: Diagnosis not present

## 2020-05-29 DIAGNOSIS — H524 Presbyopia: Secondary | ICD-10-CM | POA: Diagnosis not present

## 2020-05-29 DIAGNOSIS — H25813 Combined forms of age-related cataract, bilateral: Secondary | ICD-10-CM | POA: Diagnosis not present

## 2020-07-02 ENCOUNTER — Other Ambulatory Visit: Payer: Self-pay | Admitting: Internal Medicine

## 2020-07-26 NOTE — Progress Notes (Signed)
Subjective:    Patient ID: Andrew Woodard, male    DOB: 1950-04-30, 70 y.o.   MRN: [id number]  HPI The patient is here for an acute visit.   Ear problem -the right side near his ear and neck had slight decrease sensation, but that has resolved.  He does feel like his right eustachian tube is clogged-his ear feels clogged.  He has had an issue with wax in the past.  He tried putting in mineral oil, hydrogen peroxide and used a device to help clear the ear.  Maybe some of that helped, but he still has a clogged sensation.  At one time he did have pain in the ear that woke him up and lasted for about 3 hours, but he has not had pain since then.  He did equalize his ear at that time and it did help.  He does have some decreased hearing.  He has taken 2 Tylenol.  Uses the Nettie pot daily that helps.  He has numerous congestion than usual, but does have chronic congestion.  He may have a little dizziness.  He has cough from smoking.  He denies any fevers, sinus pain or sore throat.    Medications and allergies reviewed with patient and updated if appropriate.  Patient Active Problem List   Diagnosis Date Noted   Chronic diarrhea 11/26/2019   Hyperglycemia 04/09/2017   Osteoarthritis 04/09/2017   Alcohol abuse 04/26/2016   Depression 04/06/2015   Nonspecific abnormal electrocardiogram (ECG) (EKG) 04/13/2012   LOW BACK PAIN SYNDROME 10/09/2009   COLONIC POLYPS, HX OF 06/07/2008   LIPOMAS, MULTIPLE 01/26/2008   HYPERLIPIDEMIA 44/03/4740   DYSMETABOLIC SYNDROME X 59/56/3875   Tobacco abuse 01/01/2007   Essential hypertension 01/01/2007   ADD 06/11/2006    Current Outpatient Medications on File Prior to Visit  Medication Sig Dispense Refill   cyclobenzaprine (FLEXERIL) 10 MG tablet Take 1 tablet (10 mg total) by mouth 3 (three) times daily as needed for muscle spasms. 30 tablet 0   fluocinolone (VANOS) 0.01 % cream Apply topically 2 (two) times daily. 60 g 1   hydrochlorothiazide  (HYDRODIURIL) 25 MG tablet Take 1/2 (one-half) tablet by mouth once daily 45 tablet 2   lisinopril (ZESTRIL) 40 MG tablet Take 1 tablet by mouth once daily 90 tablet 0   meloxicam (MOBIC) 15 MG tablet Take 1 tablet by mouth once daily 90 tablet 0   metoprolol succinate (TOPROL-XL) 50 MG 24 hr tablet TAKE 1 TABLET BY MOUTH ONCE DAILY WITH  OR  IMMEDIATELY  FOLLOWING  A  MEAL. 90 tablet 0   sertraline (ZOLOFT) 100 MG tablet Take 1 tablet by mouth once daily 90 tablet 0   No current facility-administered medications on file prior to visit.    Past Medical History:  Diagnosis Date   ADD (attention deficit disorder)    Allergy    Arthritis    Depression    Fasting hyperglycemia    Hyperlipidemia    Hypertension     Past Surgical History:  Procedure Laterality Date   ABSCESS DRAINAGE  2012   L thigh, Dr Excell Seltzer (not MRSA)   COLONOSCOPY  2014   colonoscopy with polypectomy  2007   Dr Henrene Pastor   CYSTECTOMY  2011   L knee, Dr French Ana   [facility]  2013    Dr Ernestina Patches   ganglionectomy     PILONIDAL CYST EXCISION     X 2   VASECTOMY     X 2;  Dr Karsten Ro    Social History   Socioeconomic History   Marital status: Married    Spouse name: Not on file   Number of children: Not on file   Years of education: Not on file   Highest education level: Not on file  Occupational History   Not on file  Tobacco Use   Smoking status: Every Day    Packs/day: 1.00    Pack years: 0.00    Types: Cigarettes   Smokeless tobacco: Never   Tobacco comments:    smokedage18- present , up to 2 ppd for 10 years. 08/09/14 6-7 cigarettes per day.  Substance and Sexual Activity   Alcohol use: Yes    Comment: 28-32  glasses/ week of wine   Drug use: No   Sexual activity: Not on file  Other Topics Concern   Not on file  Social History Narrative   Walking regularly for exercise   Plays golf   Social Determinants of Health   Financial Resource Strain: Low Risk    Difficulty of Paying Living Expenses: Not  hard at all  Food Insecurity: No Food Insecurity   Worried About Charity fundraiser in the Last Year: Never true   Ran Out of Food in the Last Year: Never true  Transportation Needs: No Transportation Needs   Lack of Transportation (Medical): No   Lack of Transportation (Non-Medical): No  Physical Activity: Sufficiently Active   Days of Exercise per Week: 5 days   Minutes of Exercise per Session: 30 min  Stress: No Stress Concern Present   Feeling of Stress : Not at all  Social Connections: Socially Integrated   Frequency of Communication with Friends and Family: More than three times a week   Frequency of Social Gatherings with Friends and Family: More than three times a week   Attends Religious Services: More than 4 times per year   Active Member of [employer] or Organizations: Yes   Attends Music therapist: More than 4 times per year   Marital Status: Married    Family History  Problem Relation Age of Onset   Alzheimer's disease Mother    Depression Father    Kidney disease Father        ESRD   Colon cancer Maternal Grandmother 92   Heart attack Paternal Grandfather 76   Heart attack Maternal Grandfather 76        pacer   Stroke Neg Hx    Diabetes Neg Hx    Esophageal cancer Neg Hx    Rectal cancer Neg Hx    Stomach cancer Neg Hx     Review of Systems  Constitutional:  Negative for chills and fever.  HENT:  Positive for congestion (chronic, worse than usual), ear pain and hearing loss. Negative for sinus pressure, sinus pain and sore throat.   Respiratory:  Positive for cough (smoking related). Negative for shortness of breath and wheezing.   Neurological:  Positive for dizziness (a little). Negative for headaches.      Objective:   Vitals:   07/27/20 0928  BP: 126/82  Pulse: 63  Temp: 98.3 F (36.8 C)  SpO2: 96%   BP Readings from Last 3 Encounters:  07/27/20 126/82  11/26/19 122/80  11/25/18 122/74   Wt Readings from Last 3 Encounters:   07/27/20 235 lb (106.6 kg)  11/26/19 239 lb 6.4 oz (108.6 kg)  11/25/18 240 lb 12.8 oz (109.2 kg)   Body mass index is 33.72 kg/m.  Physical Exam    PRE-PROCEDURE EXAM: Right TM cannot be visualized due to total occlusion/impaction of the ear canal. PROCEDURE INDICATION: remove wax to visualize ear drum & relieve discomfort CONSENT:  Verbal  PROCEDURE NOTE:   RIGHT EAR:  The CMA used a metal wax curette under direct vision with an otoscope to free the wax bolus from the ear wall and then successfully removed a small bit of wax. The ear was then irrigated with warm water to remove the wax. POST- PROCEDURE EXAM: TMs not visualized.  All of the wax was not able to be removed secondary to it being deep in the ear canal.  We did stop attempts to prevent any damage to the ear canal or tympanic membrane.      Assessment & Plan:    See Problem List for Assessment and Plan of chronic medical problems.    This visit occurred during the SARS-CoV-2 public health emergency.  Safety protocols were in place, including screening questions prior to the visit, additional usage of staff PPE, and extensive cleaning of exam room while observing appropriate contact time as indicated for disinfecting solutions.

## 2020-07-27 ENCOUNTER — Other Ambulatory Visit: Payer: Self-pay

## 2020-07-27 ENCOUNTER — Ambulatory Visit (INDEPENDENT_AMBULATORY_CARE_PROVIDER_SITE_OTHER): Payer: Medicare HMO | Admitting: Internal Medicine

## 2020-07-27 ENCOUNTER — Encounter: Payer: Self-pay | Admitting: Internal Medicine

## 2020-07-27 VITALS — BP 126/82 | HR 63 | Temp 98.3°F | Ht 70.0 in | Wt 235.0 lb

## 2020-07-27 DIAGNOSIS — H9201 Otalgia, right ear: Secondary | ICD-10-CM | POA: Diagnosis not present

## 2020-07-27 DIAGNOSIS — H6121 Impacted cerumen, right ear: Secondary | ICD-10-CM

## 2020-07-27 NOTE — Patient Instructions (Signed)
   We cleaned out your right ear today, but there is residual wax   A referral was ordered for ENT - Dr Lucia Gaskins.       Someone from their office will call you to schedule an appointment.

## 2020-07-27 NOTE — Assessment & Plan Note (Signed)
Acute Episode of ear pain lasting about 3 hours Associated with a clogged feeling and decreased hearing Impacted cerumen-most of the wax was removed, but there is remaining wax in the ear canal that is still causing symptoms-we stopped attempts to remove any further earwax to prevent any damage to the ear canal or tympanic membrane Referral order for ENT He can continue to use over-the-counter hydrogen peroxide and mineral oil to see if that will further help with removing wax Ear pain resolved after the 1 episode and there is no evidence of infection so no further treatment is needed Expect hearing and clogged sensation to improve completely once all of the earwax has been removed by ENT

## 2020-08-21 ENCOUNTER — Other Ambulatory Visit: Payer: Self-pay | Admitting: Internal Medicine

## 2020-09-06 ENCOUNTER — Ambulatory Visit (INDEPENDENT_AMBULATORY_CARE_PROVIDER_SITE_OTHER): Payer: Medicare HMO | Admitting: Otolaryngology

## 2020-09-06 ENCOUNTER — Other Ambulatory Visit: Payer: Self-pay

## 2020-09-06 DIAGNOSIS — J310 Chronic rhinitis: Secondary | ICD-10-CM | POA: Diagnosis not present

## 2020-09-06 DIAGNOSIS — H6123 Impacted cerumen, bilateral: Secondary | ICD-10-CM | POA: Diagnosis not present

## 2020-09-06 NOTE — Progress Notes (Signed)
HPI: Andrew Woodard is a 70 y.o. male who presents is referred by his PCP Dr. Quay Burow for evaluation of wax buildup in his ears.  He apparently uses Q-tips in his ears and has wax pushed down adjacent to the right TM that was unable to be removed with irrigation..  Past Medical History:  Diagnosis Date   ADD (attention deficit disorder)    Allergy    Arthritis    Depression    Fasting hyperglycemia    Hyperlipidemia    Hypertension    Past Surgical History:  Procedure Laterality Date   ABSCESS DRAINAGE  2012   L thigh, Dr Excell Seltzer (not MRSA)   COLONOSCOPY  2014   colonoscopy with polypectomy  2007   Dr Henrene Pastor   CYSTECTOMY  2011   L knee, Dr French Ana   [facility]  2013    Dr Ernestina Patches   ganglionectomy     PILONIDAL CYST EXCISION     X 2   VASECTOMY     X 2; Dr Karsten Ro   Social History   Socioeconomic History   Marital status: Married    Spouse name: Not on file   Number of children: Not on file   Years of education: Not on file   Highest education level: Not on file  Occupational History   Not on file  Tobacco Use   Smoking status: Every Day    Packs/day: 1.00    Types: Cigarettes   Smokeless tobacco: Never   Tobacco comments:    smokedage18- present , up to 2 ppd for 10 years. 08/09/14 6-7 cigarettes per day.  Substance and Sexual Activity   Alcohol use: Yes    Comment: 28-32  glasses/ week of wine   Drug use: No   Sexual activity: Not on file  Other Topics Concern   Not on file  Social History Narrative   Walking regularly for exercise   Plays golf   Social Determinants of Health   Financial Resource Strain: Low Risk    Difficulty of Paying Living Expenses: Not hard at all  Food Insecurity: No Food Insecurity   Worried About Charity fundraiser in the Last Year: Never true   Ran Out of Food in the Last Year: Never true  Transportation Needs: No Transportation Needs   Lack of Transportation (Medical): No   Lack of Transportation (Non-Medical): No  Physical  Activity: Sufficiently Active   Days of Exercise per Week: 5 days   Minutes of Exercise per Session: 30 min  Stress: No Stress Concern Present   Feeling of Stress : Not at all  Social Connections: Socially Integrated   Frequency of Communication with Friends and Family: More than three times a week   Frequency of Social Gatherings with Friends and Family: More than three times a week   Attends Religious Services: More than 4 times per year   Active Member of [employer] or Organizations: Yes   Attends Music therapist: More than 4 times per year   Marital Status: Married   Family History  Problem Relation Age of Onset   Alzheimer's disease Mother    Depression Father    Kidney disease Father        ESRD   Colon cancer Maternal Grandmother 92   Heart attack Paternal Grandfather 76   Heart attack Maternal Grandfather 76        pacer   Stroke Neg Hx    Diabetes Neg Hx    Esophageal cancer  Neg Hx    Rectal cancer Neg Hx    Stomach cancer Neg Hx    Allergies  Allergen Reactions   Penicillins     Throat swelled   Wellbutrin [Bupropion]     Felt bad on it   Prior to Admission medications   Medication Sig Start Date End Date Taking? Authorizing Provider  cyclobenzaprine (FLEXERIL) 10 MG tablet Take 1 tablet (10 mg total) by mouth 3 (three) times daily as needed for muscle spasms. 11/26/19   Binnie Rail, MD  fluocinolone (VANOS) 0.01 % cream Apply topically 2 (two) times daily. 04/06/15   Binnie Rail, MD  hydrochlorothiazide (HYDRODIURIL) 25 MG tablet Take 1/2 (one-half) tablet by mouth once daily 08/21/20   Binnie Rail, MD  lisinopril (ZESTRIL) 40 MG tablet Take 1 tablet by mouth once daily 07/03/20   Binnie Rail, MD  meloxicam (MOBIC) 15 MG tablet Take 1 tablet by mouth once daily 08/21/20   Binnie Rail, MD  metoprolol succinate (TOPROL-XL) 50 MG 24 hr tablet TAKE 1 TABLET BY MOUTH ONCE DAILY WITH  OR  IMMEDIATELY  FOLLOWING  A  MEAL. 07/03/20   Binnie Rail, MD   sertraline (ZOLOFT) 100 MG tablet Take 1 tablet by mouth once daily 07/03/20   Binnie Rail, MD     Positive ROS: Otherwise negative.  Patient has had a previous history of sinus problems and was recommended surgery by Dr. Ernesto Rutherford a number of years ago as he was having a lot of sinus issues more on the right side.  He is doing better with this presently.  All other systems have been reviewed and were otherwise negative with the exception of those mentioned in the HPI and as above.  Physical Exam: Constitutional: Alert, well-appearing, no acute distress Ears: External ears without lesions or tenderness.  Right ear canal has a large amount of wax pushed adjacent to the right TM that was difficult to remove but was able to be removed with a curved pick and hydroperoxide and suction.  He had a small amount of wax on the left TM that was removed with suction.  Ear canals were otherwise clear.  Both TMs were clear with good mobility on pneumatic otoscopy. Nasal: External nose without lesions. Septum with minimal deformity and mild rhinitis.  Both middle meatus regions were clear with no polyps and no signs of active infection.. Oral: Lips and gums without lesions. Tongue and palate mucosa without lesions. Posterior oropharynx clear. Neck: No palpable adenopathy or masses Respiratory: Breathing comfortably  Skin: No facial/neck lesions or rash noted.  Cerumen impaction removal  Date/Time: 09/06/2020 10:22 AM Performed by: Rozetta Nunnery, MD Authorized by: Rozetta Nunnery, MD   Consent:    Consent obtained:  Verbal   Consent given by:  Patient   Risks discussed:  Pain and bleeding Procedure details:    Location:  L ear and R ear   Procedure type: curette and suction   Post-procedure details:    Inspection:  TM intact and canal normal   Hearing quality:  Improved   Procedure completion:  Tolerated well, no immediate complications Comments:     Large amount of wax adjacent to  the right TM was removed with curette and suction and hydrogen peroxide.  Small amount of wax on the left TM was removed with suction.  TMs were clear bilaterally.  Assessment: Cerumen buildup worse on the right side Chronic rhinitis  Plan: Would recommend use of  nasal steroid spray as well as a saline rinses for his nasal sinus issues. Ears were cleaned in the office today and recommended not using Q-tips in his ears as this contributed to the impacted wax on the right side.   Radene Journey, MD   CC:

## 2020-10-01 ENCOUNTER — Other Ambulatory Visit: Payer: Self-pay | Admitting: Internal Medicine

## 2020-11-20 ENCOUNTER — Other Ambulatory Visit: Payer: Self-pay | Admitting: Internal Medicine

## 2020-11-27 ENCOUNTER — Encounter: Payer: Self-pay | Admitting: Internal Medicine

## 2020-11-27 ENCOUNTER — Encounter: Payer: Medicare HMO | Admitting: Internal Medicine

## 2020-11-27 NOTE — Patient Instructions (Addendum)
Flu and pneumonia vaccine given   Blood work was ordered.     Medications changes include :   none  Your prescription(s) have been submitted to your pharmacy. Please take as directed and contact our office if you believe you are having problem(s) with the medication(s).    Please followup in 1 year    Health Maintenance, Male Adopting a healthy lifestyle and getting preventive care are important in promoting health and wellness. Ask your health care provider about: The right schedule for you to have regular tests and exams. Things you can do on your own to prevent diseases and keep yourself healthy. What should I know about diet, weight, and exercise? Eat a healthy diet  Eat a diet that includes plenty of vegetables, fruits, low-fat dairy products, and lean protein. Do not eat a lot of foods that are high in solid fats, added sugars, or sodium. Maintain a healthy weight Body mass index (BMI) is a measurement that can be used to identify possible weight problems. It estimates body fat based on height and weight. Your health care provider can help determine your BMI and help you achieve or maintain a healthy weight. Get regular exercise Get regular exercise. This is one of the most important things you can do for your health. Most adults should: Exercise for at least 150 minutes each week. The exercise should increase your heart rate and make you sweat (moderate-intensity exercise). Do strengthening exercises at least twice a week. This is in addition to the moderate-intensity exercise. Spend less time sitting. Even light physical activity can be beneficial. Watch cholesterol and blood lipids Have your blood tested for lipids and cholesterol at 68 years of age, then have this test every 5 years. You may need to have your cholesterol levels checked more often if: Your lipid or cholesterol levels are high. You are older than 68 years of age. You are at high risk for heart  disease. What should I know about cancer screening? Many types of cancers can be detected early and may often be prevented. Depending on your health history and family history, you may need to have cancer screening at various ages. This may include screening for: Colorectal cancer. Prostate cancer. Skin cancer. Lung cancer. What should I know about heart disease, diabetes, and high blood pressure? Blood pressure and heart disease High blood pressure causes heart disease and increases the risk of stroke. This is more likely to develop in people who have high blood pressure readings or are overweight. Talk with your health care provider about your target blood pressure readings. Have your blood pressure checked: Every 3-5 years if you are 34-15 years of age. Every year if you are 26 years old or older. If you are between the ages of 47 and 53 and are a current or former smoker, ask your health care provider if you should have a one-time screening for abdominal aortic aneurysm (AAA). Diabetes Have regular diabetes screenings. This checks your fasting blood sugar level. Have the screening done: Once every three years after age 57 if you are at a normal weight and have a low risk for diabetes. More often and at a younger age if you are overweight or have a high risk for diabetes. What should I know about preventing infection? Hepatitis B If you have a higher risk for hepatitis B, you should be screened for this virus. Talk with your health care provider to find out if you are at risk for hepatitis B infection.  Hepatitis C Blood testing is recommended for: Everyone born from [birth year] through 1963. Anyone with known risk factors for hepatitis C. Sexually transmitted infections (STIs) You should be screened each year for STIs, including gonorrhea and chlamydia, if: You are sexually active and are younger than 68 years of age. You are older than 68 years of age and your health care provider tells you  that you are at risk for this type of infection. Your sexual activity has changed since you were last screened, and you are at increased risk for chlamydia or gonorrhea. Ask your health care provider if you are at risk. Ask your health care provider about whether you are at high risk for HIV. Your health care provider may recommend a prescription medicine to help prevent HIV infection. If you choose to take medicine to prevent HIV, you should first get tested for HIV. You should then be tested every 3 months for as long as you are taking the medicine. Follow these instructions at home: Alcohol use Do not drink alcohol if your health care provider tells you not to drink. If you drink alcohol: Limit how much you have to 0-2 drinks a day. Know how much alcohol is in your drink. In the [place] one drink equals one 12 oz bottle of beer (355 mL), one 5 oz glass of wine (148 mL), or one 1 oz glass of hard liquor (44 mL). Lifestyle Do not use any products that contain nicotine or tobacco. These products include cigarettes, chewing tobacco, and vaping devices, such as e-cigarettes. If you need help quitting, ask your health care provider. Do not use street drugs. Do not share needles. Ask your health care provider for help if you need support or information about quitting drugs. General instructions Schedule regular health, dental, and eye exams. Stay current with your vaccines. Tell your health care provider if: You often feel depressed. You have ever been abused or do not feel safe at home. Summary Adopting a healthy lifestyle and getting preventive care are important in promoting health and wellness. Follow your health care provider's instructions about healthy diet, exercising, and getting tested or screened for diseases. Follow your health care provider's instructions on monitoring your cholesterol and blood pressure. This information is not intended to replace advice given to you by your health  care provider. Make sure you discuss any questions you have with your health care provider. Document Revised: 05/29/2020 Document Reviewed: 05/29/2020 Elsevier Patient Education  [place].

## 2020-11-27 NOTE — Progress Notes (Signed)
Subjective:    Patient ID: Andrew Woodard, male    DOB: 05-Nov-1950, 70 y.o.   MRN: [id number]   This visit occurred during the SARS-CoV-2 public health emergency.  Safety protocols were in place, including screening questions prior to the visit, additional usage of staff PPE, and extensive cleaning of exam room while observing appropriate contact time as indicated for disinfecting solutions.   HPI He is here for a physical exam.   He denies changes in his health.  No concerns.   Medications and allergies reviewed with patient and updated if appropriate.  Patient Active Problem List   Diagnosis Date Noted   Ear pain, right 07/27/2020   Chronic diarrhea 11/26/2019   Hyperglycemia 04/09/2017   Osteoarthritis 04/09/2017   Alcohol abuse 04/26/2016   Depression 04/06/2015   Nonspecific abnormal electrocardiogram (ECG) (EKG) 04/13/2012   LOW BACK PAIN SYNDROME 10/09/2009   COLONIC POLYPS, HX OF 06/07/2008   LIPOMAS, MULTIPLE 01/26/2008   HYPERLIPIDEMIA 85/27/7824   DYSMETABOLIC SYNDROME X 23/53/6144   Tobacco dependence due to cigarettes 01/01/2007   Essential hypertension 01/01/2007   ADD 06/11/2006    Current Outpatient Medications on File Prior to Visit  Medication Sig Dispense Refill   cyclobenzaprine (FLEXERIL) 10 MG tablet Take 1 tablet (10 mg total) by mouth 3 (three) times daily as needed for muscle spasms. 30 tablet 0   fluocinolone (VANOS) 0.01 % cream Apply topically 2 (two) times daily. 60 g 1   hydrochlorothiazide (HYDRODIURIL) 25 MG tablet Take 1/2 (one-half) tablet by mouth once daily 45 tablet 0   lisinopril (ZESTRIL) 40 MG tablet Take 1 tablet by mouth once daily 90 tablet 0   meloxicam (MOBIC) 15 MG tablet Take 1 tablet by mouth once daily 90 tablet 0   metoprolol succinate (TOPROL-XL) 50 MG 24 hr tablet TAKE 1 TABLET BY MOUTH ONCE DAILY WITH  OR  IMMEDIATELY  FOLLOWING  A  MEAL 90 tablet 0   sertraline (ZOLOFT) 100 MG tablet Take 1 tablet by mouth once daily 90  tablet 0   No current facility-administered medications on file prior to visit.    Past Medical History:  Diagnosis Date   ADD (attention deficit disorder)    Allergy    Arthritis    Depression    Fasting hyperglycemia    Hyperlipidemia    Hypertension     Past Surgical History:  Procedure Laterality Date   ABSCESS DRAINAGE  2012   L thigh, Dr Excell Seltzer (not MRSA)   COLONOSCOPY  2014   colonoscopy with polypectomy  2007   Dr Henrene Pastor   CYSTECTOMY  2011   L knee, Dr French Ana   [facility]  2013    Dr Ernestina Patches   ganglionectomy     PILONIDAL CYST EXCISION     X 2   VASECTOMY     X 2; Dr Karsten Ro    Social History   Socioeconomic History   Marital status: Married    Spouse name: Not on file   Number of children: Not on file   Years of education: Not on file   Highest education level: Not on file  Occupational History   Not on file  Tobacco Use   Smoking status: Every Day    Packs/day: 1.00    Types: Cigarettes   Smokeless tobacco: Never   Tobacco comments:    smokedage18- present , up to 2 ppd for 10 years. 08/09/14 6-7 cigarettes per day.  Substance and Sexual Activity   Alcohol  use: Yes    Comment: [age]  glasses/ week of wine   Drug use: No   Sexual activity: Not on file  Other Topics Concern   Not on file  Social History Narrative   Walking regularly for exercise   Plays golf   Social Determinants of Health   Financial Resource Strain: Not on file  Food Insecurity: Not on file  Transportation Needs: Not on file  Physical Activity: Not on file  Stress: Not on file  Social Connections: Not on file    Family History  Problem Relation Age of Onset   Alzheimer's disease Mother    Depression Father    Kidney disease Father        ESRD   Colon cancer Maternal Grandmother 92   Heart attack Paternal Grandfather 76   Heart attack Maternal Grandfather 76        pacer   Stroke Neg Hx    Diabetes Neg Hx    Esophageal cancer Neg Hx    Rectal cancer Neg Hx     Stomach cancer Neg Hx     Review of Systems  Constitutional:  Negative for chills and fever.  HENT:  Positive for postnasal drip and sinus pressure.   Eyes:  Negative for visual disturbance.  Respiratory:  Positive for cough. Negative for choking, shortness of breath and wheezing.   Cardiovascular:  Negative for chest pain, palpitations and leg swelling.  Gastrointestinal:  Positive for diarrhea (chronic). Negative for abdominal pain, blood in stool, constipation and nausea.       Rare gerd  Genitourinary:  Negative for dysuria.  Musculoskeletal:  Positive for arthralgias and back pain (chronic - stiffness).  Skin:  Negative for color change and rash.  Neurological:  Negative for light-headedness and headaches.  Psychiatric/Behavioral:  Positive for dysphoric mood. The patient is nervous/anxious (mild).       Objective:   Vitals:   11/28/20 1417  BP: 112/80  Pulse: 73  Temp: 98.2 F (36.8 C)  SpO2: 95%   Filed Weights   11/28/20 1417  Weight: 234 lb (106.1 kg)   Body mass index is 33.58 kg/m.  BP Readings from Last 3 Encounters:  11/28/20 112/80  07/27/20 126/82  11/26/19 122/80    Wt Readings from Last 3 Encounters:  11/28/20 234 lb (106.1 kg)  07/27/20 235 lb (106.6 kg)  11/26/19 239 lb 6.4 oz (108.6 kg)     Physical Exam Constitutional: He appears well-developed and well-nourished. No distress.  HENT:  Head: Normocephalic and atraumatic.  Right Ear: External ear normal.  Left Ear: External ear normal.  Mouth/Throat: Oropharynx is clear and moist.  Normal ear canals and TM b/l  Eyes: Conjunctivae and EOM are normal.  Neck: Neck supple. No tracheal deviation present. No thyromegaly present.  No carotid bruit  Cardiovascular: Normal rate, regular rhythm, normal heart sounds and intact distal pulses.   No murmur heard. Pulmonary/Chest: Effort normal and breath sounds normal. No respiratory distress. He has no wheezes. He has no rales.  Abdominal: Soft. He  exhibits no distension. There is no tenderness.  Genitourinary: deferred  Musculoskeletal: He exhibits no edema.  Lymphadenopathy:   He has no cervical adenopathy.  Skin: Skin is warm and dry. He is not diaphoretic.  Psychiatric: He has a normal mood and affect. His behavior is normal.    The 10-year ASCVD risk score (Arnett DK, et al., 2019) is: 18.5%   Values used to calculate the score:  Age: 66 years     Sex: Male     Is Non-Hispanic African American: No     Diabetic: No     Tobacco smoker: Yes     Systolic Blood Pressure: 168 mmHg     Is BP treated: Yes     HDL Cholesterol: 46.5 mg/dL     Total Cholesterol: 186 mg/dL      Assessment & Plan:   Physical exam: Screening blood work  ordered Exercise   walks 1 mile daily, yard work, Office manager Weight  advised weight loss Substance abuse   stressed cutting back on alcohol and stopping smoking  High risk for CAD - advised to consider starting statin and getting a coronary calcium sore   Reviewed recommended immunizations. Pneumovax 23 and flu vaccine today   Health Maintenance  Topic Date Due   Pneumonia Vaccine 15+ Years old (2 - PPSV23 if available, else PCV20) 10/14/2019   INFLUENZA VACCINE  08/21/2020   COVID-19 Vaccine (3 - Booster for [place] series) 12/14/2020 (Originally 05/20/2019)   TETANUS/TDAP  11/28/2021 (Originally 06/08/2018)   COLONOSCOPY (Pts 45-21yrs Insurance coverage will need to be confirmed)  06/05/2022   Hepatitis C Screening  Completed   Zoster Vaccines- Shingrix  Completed   HPV VACCINES  Aged Out     See Problem List for Assessment and Plan of chronic medical problems.

## 2020-11-28 ENCOUNTER — Ambulatory Visit (INDEPENDENT_AMBULATORY_CARE_PROVIDER_SITE_OTHER): Payer: Medicare HMO | Admitting: Internal Medicine

## 2020-11-28 ENCOUNTER — Other Ambulatory Visit: Payer: Self-pay

## 2020-11-28 VITALS — BP 112/80 | HR 73 | Temp 98.2°F | Ht 70.0 in | Wt 234.0 lb

## 2020-11-28 DIAGNOSIS — M159 Polyosteoarthritis, unspecified: Secondary | ICD-10-CM

## 2020-11-28 DIAGNOSIS — Z125 Encounter for screening for malignant neoplasm of prostate: Secondary | ICD-10-CM | POA: Diagnosis not present

## 2020-11-28 DIAGNOSIS — I10 Essential (primary) hypertension: Secondary | ICD-10-CM

## 2020-11-28 DIAGNOSIS — F17210 Nicotine dependence, cigarettes, uncomplicated: Secondary | ICD-10-CM

## 2020-11-28 DIAGNOSIS — Z0000 Encounter for general adult medical examination without abnormal findings: Secondary | ICD-10-CM | POA: Diagnosis not present

## 2020-11-28 DIAGNOSIS — F3289 Other specified depressive episodes: Secondary | ICD-10-CM

## 2020-11-28 DIAGNOSIS — E782 Mixed hyperlipidemia: Secondary | ICD-10-CM

## 2020-11-28 DIAGNOSIS — R739 Hyperglycemia, unspecified: Secondary | ICD-10-CM | POA: Diagnosis not present

## 2020-11-28 DIAGNOSIS — R69 Illness, unspecified: Secondary | ICD-10-CM | POA: Diagnosis not present

## 2020-11-28 DIAGNOSIS — F1010 Alcohol abuse, uncomplicated: Secondary | ICD-10-CM

## 2020-11-28 MED ORDER — HYDROCHLOROTHIAZIDE 25 MG PO TABS: ORAL_TABLET | ORAL | 3 refills | Status: DC

## 2020-11-28 MED ORDER — METOPROLOL SUCCINATE ER 50 MG PO TB24: ORAL_TABLET | ORAL | 3 refills | Status: DC

## 2020-11-28 MED ORDER — SERTRALINE HCL 100 MG PO TABS: 100.0000 mg | ORAL_TABLET | Freq: Every day | ORAL | 3 refills | Status: DC

## 2020-11-28 MED ORDER — LISINOPRIL 40 MG PO TABS: 40.0000 mg | ORAL_TABLET | Freq: Every day | ORAL | 3 refills | Status: DC

## 2020-11-28 MED ORDER — MELOXICAM 15 MG PO TABS: 15.0000 mg | ORAL_TABLET | Freq: Every day | ORAL | 3 refills | Status: DC

## 2020-11-28 NOTE — Assessment & Plan Note (Signed)
Chronic Does not have an interest in quitting

## 2020-11-28 NOTE — Assessment & Plan Note (Addendum)
Chronic Check lipid panel  Advised he should be on a statin - will see what blood work shows, but regardless given his high risk of CAD recommended starting a statin Regular exercise and healthy diet encouraged Work on weight loss

## 2020-11-28 NOTE — Assessment & Plan Note (Signed)
Chronic Controlled, stable Continue  sertraline 100 mg daily 

## 2020-11-28 NOTE — Assessment & Plan Note (Signed)
Chronic BP well controlled Continue hctz 12.5 mg daily, lisinopril 40 mg daily  metoprolol xl 50 mg daily cmp

## 2020-11-28 NOTE — Assessment & Plan Note (Signed)
Chronic Drinks nightly up to 2 bottles Advised decreased alcohol consumption

## 2020-11-28 NOTE — Assessment & Plan Note (Signed)
Chronic Continue meloxicam 15 mg daily prn  -- does not take daily

## 2020-11-28 NOTE — Assessment & Plan Note (Signed)
Chronic Check a1c Low sugar / carb diet Stressed regular exercise Advised weight loss 

## 2021-01-05 ENCOUNTER — Ambulatory Visit (INDEPENDENT_AMBULATORY_CARE_PROVIDER_SITE_OTHER): Payer: Medicare HMO

## 2021-01-05 ENCOUNTER — Other Ambulatory Visit: Payer: Self-pay

## 2021-01-05 DIAGNOSIS — Z0000 Encounter for general adult medical examination without abnormal findings: Secondary | ICD-10-CM | POA: Diagnosis not present

## 2021-01-05 NOTE — Progress Notes (Signed)
I connected with Andrew Woodard today by telephone and verified that I am speaking with the correct person using two identifiers. Location patient: home Location provider: work Persons participating in the virtual visit: patient, provider.   I discussed the limitations, risks, security and privacy concerns of performing an evaluation and management service by telephone and the availability of in person appointments. I also discussed with the patient that there may be a patient responsible charge related to this service. The patient expressed understanding and verbally consented to this telephonic visit.    Interactive audio and video telecommunications were attempted between this provider and patient, however failed, due to patient having technical difficulties OR patient did not have access to video capability.  We continued and completed visit with audio only.  Some vital signs may be absent or patient reported.   Time Spent with patient on telephone encounter: 40 minutes  Subjective:   Andrew Woodard is a 70 y.o. male who presents for Medicare Annual/Subsequent preventive examination.  Review of Systems     Cardiac Risk Factors include: advanced age (>53men, >65 women);family history of premature cardiovascular disease;male gender;hypertension     Objective:    There were no vitals filed for this visit. There is no height or weight on file to calculate BMI.  Advanced Directives 01/05/2021 11/23/2019  Does Patient Have a Medical Advance Directive? No No  Would patient like information on creating a medical advance directive? No - Patient declined No - Patient declined    Current Medications (verified) Outpatient Encounter Medications as of 01/05/2021  Medication Sig   cyclobenzaprine (FLEXERIL) 10 MG tablet Take 1 tablet (10 mg total) by mouth 3 (three) times daily as needed for muscle spasms.   fluocinolone (VANOS) 0.01 % cream Apply topically 2 (two) times daily.    hydrochlorothiazide (HYDRODIURIL) 25 MG tablet Take 1/2 (one-half) tablet by mouth once daily   lisinopril (ZESTRIL) 40 MG tablet Take 1 tablet (40 mg total) by mouth daily.   meloxicam (MOBIC) 15 MG tablet Take 1 tablet (15 mg total) by mouth daily.   metoprolol succinate (TOPROL-XL) 50 MG 24 hr tablet Take with or immediately following a meal.   sertraline (ZOLOFT) 100 MG tablet Take 1 tablet (100 mg total) by mouth daily.   No facility-administered encounter medications on file as of 01/05/2021.    Allergies (verified) Penicillins and Wellbutrin [bupropion]   History: Past Medical History:  Diagnosis Date   ADD (attention deficit disorder)    Allergy    Arthritis    Depression    Fasting hyperglycemia    Hyperlipidemia    Hypertension    Past Surgical History:  Procedure Laterality Date   ABSCESS DRAINAGE  2012   L thigh, Dr Excell Seltzer (not MRSA)   COLONOSCOPY  2014   colonoscopy with polypectomy  2007   Dr Henrene Pastor   CYSTECTOMY  2011   L knee, Dr French Ana   [facility]  2013    Dr Ernestina Patches   ganglionectomy     PILONIDAL CYST EXCISION     X 2   VASECTOMY     X 2; Dr Karsten Ro   Family History  Problem Relation Age of Onset   Alzheimer's disease Mother    Depression Father    Kidney disease Father        ESRD   Colon cancer Maternal Grandmother 76   Heart attack Paternal Grandfather 51   Heart attack Maternal Grandfather 76        pacer  Stroke Neg Hx    Diabetes Neg Hx    Esophageal cancer Neg Hx    Rectal cancer Neg Hx    Stomach cancer Neg Hx    Social History   Socioeconomic History   Marital status: Married    Spouse name: Not on file   Number of children: Not on file   Years of education: Not on file   Highest education level: Not on file  Occupational History   Not on file  Tobacco Use   Smoking status: Every Day    Packs/day: 1.00    Types: Cigarettes   Smokeless tobacco: Never   Tobacco comments:    smokedage18- present , up to 2 ppd for 10 years.  08/09/14 6-7 cigarettes per day.  Substance and Sexual Activity   Alcohol use: Yes    Comment: 28-32  glasses/ week of wine   Drug use: No   Sexual activity: Not on file  Other Topics Concern   Not on file  Social History Narrative   Walking regularly for exercise   Plays golf   Social Determinants of Health   Financial Resource Strain: Low Risk    Difficulty of Paying Living Expenses: Not hard at all  Food Insecurity: No Food Insecurity   Worried About Charity fundraiser in the Last Year: Never true   Ran Out of Food in the Last Year: Never true  Transportation Needs: No Transportation Needs   Lack of Transportation (Medical): No   Lack of Transportation (Non-Medical): No  Physical Activity: Sufficiently Active   Days of Exercise per Week: 7 days   Minutes of Exercise per Session: 30 min  Stress: No Stress Concern Present   Feeling of Stress : Not at all  Social Connections: Socially Integrated   Frequency of Communication with Friends and Family: More than three times a week   Frequency of Social Gatherings with Friends and Family: More than three times a week   Attends Religious Services: More than 4 times per year   Active Member of [employer] or Organizations: Yes   Attends Music therapist: More than 4 times per year   Marital Status: Married    Tobacco Counseling Ready to quit: Not Answered Counseling given: Not Answered Tobacco comments: smokedage18- present , up to 2 ppd for 10 years. 08/09/14 6-7 cigarettes per day.   Clinical Intake:  Pre-visit preparation completed: Yes  Pain : No/denies pain     Nutritional Risks: None Diabetes: No  How often do you need to have someone help you when you read instructions, pamphlets, or other written materials from your doctor or pharmacy?: 1 - Never What is the last grade level you completed in school?: Bachelor's Degree  Diabetic? no  Interpreter Needed?: No  Information entered by :: Lisette Abu,  LPN   Activities of Daily Living In your present state of health, do you have any difficulty performing the following activities: 01/05/2021 11/28/2020  Hearing? N N  Vision? N N  Difficulty concentrating or making decisions? N N  Walking or climbing stairs? N N  Dressing or bathing? N N  Doing errands, shopping? N N  Preparing Food and eating ? N -  Using the Toilet? N -  In the past six months, have you accidently leaked urine? N -  Do you have problems with loss of bowel control? N -  Managing your Medications? N -  Managing your Finances? N -  Housekeeping or managing your Housekeeping?  N -  Some recent data might be hidden    Patient Care Team: Binnie Rail, MD as PCP - General (Internal Medicine) Shon Hough, MD as Consulting Physician (Ophthalmology)  Indicate any recent Medical Services you may have received from other than [facility] providers in the past year (date may be approximate).     Assessment:   This is a routine wellness examination for [place].  Hearing/Vision screen Hearing Screening - Comments:: Patient denied any hearing difficulty.   No hearing aids.  Vision Screening - Comments:: Patient wears corrective glasses/contacts.  Eye exam done annually by: [facility]   Dietary issues and exercise activities discussed: Current Exercise Habits: Home exercise routine, Type of exercise: walking;Other - see comments (golfing, yard work, walking the dog everyday), Time (Minutes): 30, Frequency (Times/Week): 7, Weekly Exercise (Minutes/Week): 210, Intensity: Moderate, Exercise limited by: None identified   Goals Addressed               This Visit's Progress     Patient Stated (pt-stated)        My goal is to increase my physical activity by walking more.      Depression Screen PHQ 2/9 Scores 01/05/2021 11/23/2019 04/09/2017  PHQ - 2 Score 0 0 0    Fall Risk Fall Risk  01/05/2021 11/28/2020 11/23/2019 04/09/2017  Falls in  the past year? 0 0 0 No  Number falls in past yr: 0 0 0 -  Injury with Fall? 0 0 0 -  Risk for fall due to : No Fall Risks No Fall Risks No Fall Risks -  Follow up Falls prevention discussed Falls evaluation completed Falls evaluation completed -    FALL RISK PREVENTION PERTAINING TO THE HOME:  Any stairs in or around the home? Yes  If so, are there any without handrails? No  Home free of loose throw rugs in walkways, pet beds, electrical cords, etc? Yes  Adequate lighting in your home to reduce risk of falls? Yes   ASSISTIVE DEVICES UTILIZED TO PREVENT FALLS:  Life alert? No  Use of a cane, walker or w/c? No  Grab bars in the bathroom? Yes  Shower chair or bench in shower? No  Elevated toilet seat or a handicapped toilet? Yes   TIMED UP AND GO:  Was the test performed? No .  Length of time to ambulate 10 feet: n/a sec.   Gait steady and fast without use of assistive device  Cognitive Function: Normal cognitive status assessed by direct observation by this Nurse Health Advisor. No abnormalities found.          Immunizations Immunization History  Administered Date(s) Administered   Fluad Quad(high Dose 65+) 10/14/2018, 12/02/2019   Influenza Whole 10/22/2011   Influenza, High Dose Seasonal PF 02/09/2018   Influenza,inj,Quad PF,6+ Mos 01/05/2015   Influenza-Unspecified 10/21/2016, 11/28/2020   PFIZER(Purple Top)SARS-COV-2 Vaccination 02/28/2019, 03/25/2019   Pneumococcal Conjugate-13 10/14/2018   Pneumococcal Polysaccharide-23 11/28/2020   Pneumococcal-Unspecified 10/22/2011   Td 06/07/2008   Zoster Recombinat (Shingrix) 10/17/2017, 02/09/2018    TDAP status: Due, Education has been provided regarding the importance of this vaccine. Advised may receive this vaccine at local pharmacy or Health Dept. Aware to provide a copy of the vaccination record if obtained from local pharmacy or Health Dept. Verbalized acceptance and understanding.  Flu Vaccine status: Up to  date  Pneumococcal vaccine status: Up to date  Covid-19 vaccine status: Completed vaccines  Qualifies for Shingles Vaccine? Yes   Zostavax completed  No   Shingrix Completed?: Yes  Screening Tests Health Maintenance  Topic Date Due   COVID-19 Vaccine (3 - Booster for Pfizer series) 05/20/2019   TETANUS/TDAP  11/28/2021 (Originally 06/08/2018)   COLONOSCOPY (Pts 45-76yrs Insurance coverage will need to be confirmed)  06/05/2022   Pneumonia Vaccine 76+ Years old  Completed   INFLUENZA VACCINE  Completed   Hepatitis C Screening  Completed   Zoster Vaccines- Shingrix  Completed   HPV VACCINES  Aged Out    Health Maintenance  Health Maintenance Due  Topic Date Due   COVID-19 Vaccine (3 - Booster for [place] series) 05/20/2019    Colorectal cancer screening: Type of screening: Colonoscopy. Completed 06/04/2012. Repeat every 10 years  Lung Cancer Screening: (Low Dose CT Chest recommended if Age 62-80 years, 30 pack-year currently smoking OR have quit w/in 15years.) does qualify.   Lung Cancer Screening Referral: no  Additional Screening:  Hepatitis C Screening: does qualify; Completed yes  Vision Screening: Recommended annual ophthalmology exams for early detection of glaucoma and other disorders of the eye. Is the patient up to date with their annual eye exam?  Yes  Who is the provider or what is the name of the office in which the patient attends annual eye exams? [facility] If pt is not established with a provider, would they like to be referred to a provider to establish care? No .   Dental Screening: Recommended annual dental exams for proper oral hygiene  Community Resource Referral / Chronic Care Management: CRR required this visit?  No   CCM required this visit?  No      Plan:     I have personally reviewed and noted the following in the patients chart:   Medical and social history Use of alcohol, tobacco or illicit drugs   Current medications and supplements including opioid prescriptions. Patient is not currently taking opioid prescriptions. Functional ability and status Nutritional status Physical activity Advanced directives List of other physicians Hospitalizations, surgeries, and ER visits in previous 12 months Vitals Screenings to include cognitive, depression, and falls Referrals and appointments  In addition, I have reviewed and discussed with patient certain preventive protocols, quality metrics, and best practice recommendations. A written personalized care plan for preventive services as well as general preventive health recommendations were provided to patient.     Sheral Flow, LPN   09/47/0962   Nurse Notes:  Patient is cogitatively intact. There were no vitals filed for this visit. There is no height or weight on file to calculate BMI. Patient stated that he has no issues with gait or balance; does not use any assistive devices. Hearing Screening - Comments:: Patient denied any hearing difficulty.   No hearing aids.  Vision Screening - Comments:: Patient wears corrective glasses/contacts.  Eye exam done annually by: [facility]

## 2021-02-20 DIAGNOSIS — D2230 Melanocytic nevi of unspecified part of face: Secondary | ICD-10-CM | POA: Diagnosis not present

## 2021-02-20 DIAGNOSIS — L719 Rosacea, unspecified: Secondary | ICD-10-CM | POA: Diagnosis not present

## 2021-02-20 DIAGNOSIS — L570 Actinic keratosis: Secondary | ICD-10-CM | POA: Diagnosis not present

## 2021-02-20 DIAGNOSIS — Z23 Encounter for immunization: Secondary | ICD-10-CM | POA: Diagnosis not present

## 2021-05-21 DIAGNOSIS — L720 Epidermal cyst: Secondary | ICD-10-CM | POA: Diagnosis not present

## 2021-05-21 DIAGNOSIS — L570 Actinic keratosis: Secondary | ICD-10-CM | POA: Diagnosis not present

## 2021-05-21 DIAGNOSIS — D1801 Hemangioma of skin and subcutaneous tissue: Secondary | ICD-10-CM | POA: Diagnosis not present

## 2021-06-04 DIAGNOSIS — H2513 Age-related nuclear cataract, bilateral: Secondary | ICD-10-CM | POA: Diagnosis not present

## 2021-06-04 DIAGNOSIS — H524 Presbyopia: Secondary | ICD-10-CM | POA: Diagnosis not present

## 2021-06-04 DIAGNOSIS — H25013 Cortical age-related cataract, bilateral: Secondary | ICD-10-CM | POA: Diagnosis not present

## 2021-06-04 DIAGNOSIS — H43813 Vitreous degeneration, bilateral: Secondary | ICD-10-CM | POA: Diagnosis not present

## 2021-08-21 DIAGNOSIS — D2230 Melanocytic nevi of unspecified part of face: Secondary | ICD-10-CM | POA: Diagnosis not present

## 2021-08-21 DIAGNOSIS — L570 Actinic keratosis: Secondary | ICD-10-CM | POA: Diagnosis not present

## 2021-08-21 DIAGNOSIS — R58 Hemorrhage, not elsewhere classified: Secondary | ICD-10-CM | POA: Diagnosis not present

## 2021-08-21 DIAGNOSIS — L821 Other seborrheic keratosis: Secondary | ICD-10-CM | POA: Diagnosis not present

## 2021-11-26 ENCOUNTER — Encounter: Payer: Self-pay | Admitting: Internal Medicine

## 2021-11-27 ENCOUNTER — Other Ambulatory Visit (INDEPENDENT_AMBULATORY_CARE_PROVIDER_SITE_OTHER): Payer: Medicare HMO

## 2021-11-27 DIAGNOSIS — I10 Essential (primary) hypertension: Secondary | ICD-10-CM

## 2021-11-27 DIAGNOSIS — Z0000 Encounter for general adult medical examination without abnormal findings: Secondary | ICD-10-CM | POA: Diagnosis not present

## 2021-11-27 DIAGNOSIS — R739 Hyperglycemia, unspecified: Secondary | ICD-10-CM

## 2021-11-27 DIAGNOSIS — Z125 Encounter for screening for malignant neoplasm of prostate: Secondary | ICD-10-CM

## 2021-11-27 DIAGNOSIS — E782 Mixed hyperlipidemia: Secondary | ICD-10-CM | POA: Diagnosis not present

## 2021-11-27 LAB — COMPREHENSIVE METABOLIC PANEL
ALT: 26 U/L (ref 0–53)
AST: 25 U/L (ref 0–37)
Albumin: 4.3 g/dL (ref 3.5–5.2)
Alkaline Phosphatase: 60 U/L (ref 39–117)
BUN: 19 mg/dL (ref 6–23)
CO2: 24 mEq/L (ref 19–32)
Calcium: 9.6 mg/dL (ref 8.4–10.5)
Chloride: 98 mEq/L (ref 96–112)
Creatinine, Ser: 0.99 mg/dL (ref 0.40–1.50)
GFR: 77.87 mL/min (ref >60.00)
Glucose, Bld: 108 mg/dL — ABNORMAL HIGH (ref 70–99)
Potassium: 4.3 mEq/L (ref 3.5–5.1)
Sodium: 132 mEq/L — ABNORMAL LOW (ref 135–145)
Total Bilirubin: 0.5 mg/dL (ref 0.2–1.2)
Total Protein: 7.4 g/dL (ref 6.0–8.3)

## 2021-11-27 LAB — CBC WITH DIFFERENTIAL/PLATELET
Basophils Absolute: 0.1 10*3/uL (ref 0.0–0.1)
Basophils Relative: 1 % (ref 0.0–3.0)
Eosinophils Absolute: 0.2 10*3/uL (ref 0.0–0.7)
Eosinophils Relative: 2.2 % (ref 0.0–5.0)
HCT: 44.2 % (ref 39.0–52.0)
Hemoglobin: 15.2 g/dL (ref 13.0–17.0)
Lymphocytes Relative: 33.5 % (ref 12.0–46.0)
Lymphs Abs: 2.7 10*3/uL (ref 0.7–4.0)
MCHC: 34.3 g/dL (ref 30.0–36.0)
MCV: 104.3 fl — ABNORMAL HIGH (ref 78.0–100.0)
Monocytes Absolute: 0.9 10*3/uL (ref 0.1–1.0)
Monocytes Relative: 11 % (ref 3.0–12.0)
Neutro Abs: 4.3 10*3/uL (ref 1.4–7.7)
Neutrophils Relative %: 52.3 % (ref 43.0–77.0)
Platelets: 236 10*3/uL (ref 150.0–400.0)
RBC: 4.24 Mil/uL (ref 4.22–5.81)
RDW: 13.4 % (ref 11.5–15.5)
WBC: 8.2 10*3/uL (ref 4.0–10.5)

## 2021-11-27 LAB — LIPID PANEL
Cholesterol: 181 mg/dL (ref 0–200)
HDL: 55.6 mg/dL (ref >39.00)
LDL Cholesterol: 105 mg/dL — ABNORMAL HIGH (ref 0–99)
NonHDL: 125.35
Total CHOL/HDL Ratio: 3
Triglycerides: 100 mg/dL (ref 0.0–149.0)
VLDL: 20 mg/dL (ref 0.0–40.0)

## 2021-11-27 LAB — PSA: PSA: 1.34 ng/mL (ref 0.10–4.00)

## 2021-11-27 LAB — HEMOGLOBIN A1C: Hgb A1c MFr Bld: 6 % (ref 4.6–6.5)

## 2021-11-27 LAB — TSH: TSH: 1.13 u[IU]/mL (ref 0.35–5.50)

## 2021-11-28 ENCOUNTER — Encounter: Payer: Self-pay | Admitting: Internal Medicine

## 2021-11-28 NOTE — Patient Instructions (Addendum)
Medications changes include :   stop the hydrochlorothiazide for now.  Chantix.   Cialis.     A Ct of your heart was ordered.     Someone will call you to schedule an appointment.    Return in about 1 year (around 11/30/2022) for Physical Exam.    Health Maintenance, Male Adopting a healthy lifestyle and getting preventive care are important in promoting health and wellness. Ask your health care provider about: The right schedule for you to have regular tests and exams. Things you can do on your own to prevent diseases and keep yourself healthy. What should I know about diet, weight, and exercise? Eat a healthy diet  Eat a diet that includes plenty of vegetables, fruits, low-fat dairy products, and lean protein. Do not eat a lot of foods that are high in solid fats, added sugars, or sodium. Maintain a healthy weight Body mass index (BMI) is a measurement that can be used to identify possible weight problems. It estimates body fat based on height and weight. Your health care provider can help determine your BMI and help you achieve or maintain a healthy weight. Get regular exercise Get regular exercise. This is one of the most important things you can do for your health. Most adults should: Exercise for at least 150 minutes each week. The exercise should increase your heart rate and make you sweat (moderate-intensity exercise). Do strengthening exercises at least twice a week. This is in addition to the moderate-intensity exercise. Spend less time sitting. Even light physical activity can be beneficial. Watch cholesterol and blood lipids Have your blood tested for lipids and cholesterol at 69 years of age, then have this test every 5 years. You may need to have your cholesterol levels checked more often if: Your lipid or cholesterol levels are high. You are older than 69 years of age. You are at high risk for heart disease. What should I know about cancer screening? Many types  of cancers can be detected early and may often be prevented. Depending on your health history and family history, you may need to have cancer screening at various ages. This may include screening for: Colorectal cancer. Prostate cancer. Skin cancer. Lung cancer. What should I know about heart disease, diabetes, and high blood pressure? Blood pressure and heart disease High blood pressure causes heart disease and increases the risk of stroke. This is more likely to develop in people who have high blood pressure readings or are overweight. Talk with your health care provider about your target blood pressure readings. Have your blood pressure checked: Every 3-5 years if you are 62-41 years of age. Every year if you are 68 years old or older. If you are between the ages of 53 and 43 and are a current or former smoker, ask your health care provider if you should have a one-time screening for abdominal aortic aneurysm (AAA). Diabetes Have regular diabetes screenings. This checks your fasting blood sugar level. Have the screening done: Once every three years after age 72 if you are at a normal weight and have a low risk for diabetes. More often and at a younger age if you are overweight or have a high risk for diabetes. What should I know about preventing infection? Hepatitis B If you have a higher risk for hepatitis B, you should be screened for this virus. Talk with your health care provider to find out if you are at risk for hepatitis B infection. Hepatitis C  Blood testing is recommended for: Everyone born from [birth year] through 1963. Anyone with known risk factors for hepatitis C. Sexually transmitted infections (STIs) You should be screened each year for STIs, including gonorrhea and chlamydia, if: You are sexually active and are younger than 69 years of age. You are older than 69 years of age and your health care provider tells you that you are at risk for this type of infection. Your sexual  activity has changed since you were last screened, and you are at increased risk for chlamydia or gonorrhea. Ask your health care provider if you are at risk. Ask your health care provider about whether you are at high risk for HIV. Your health care provider may recommend a prescription medicine to help prevent HIV infection. If you choose to take medicine to prevent HIV, you should first get tested for HIV. You should then be tested every 3 months for as long as you are taking the medicine. Follow these instructions at home: Alcohol use Do not drink alcohol if your health care provider tells you not to drink. If you drink alcohol: Limit how much you have to 0-2 drinks a day. Know how much alcohol is in your drink. In the [place] one drink equals one 12 oz bottle of beer (355 mL), one 5 oz glass of wine (148 mL), or one 1 oz glass of hard liquor (44 mL). Lifestyle Do not use any products that contain nicotine or tobacco. These products include cigarettes, chewing tobacco, and vaping devices, such as e-cigarettes. If you need help quitting, ask your health care provider. Do not use street drugs. Do not share needles. Ask your health care provider for help if you need support or information about quitting drugs. General instructions Schedule regular health, dental, and eye exams. Stay current with your vaccines. Tell your health care provider if: You often feel depressed. You have ever been abused or do not feel safe at home. Summary Adopting a healthy lifestyle and getting preventive care are important in promoting health and wellness. Follow your health care provider's instructions about healthy diet, exercising, and getting tested or screened for diseases. Follow your health care provider's instructions on monitoring your cholesterol and blood pressure. This information is not intended to replace advice given to you by your health care provider. Make sure you discuss any questions you have  with your health care provider. Document Revised: 05/29/2020 Document Reviewed: 05/29/2020 Elsevier Patient Education  [place].

## 2021-11-28 NOTE — Progress Notes (Unsigned)
Subjective:    Patient ID: Andrew Woodard, male    DOB: 1950/01/23, 71 y.o.   MRN: [id number]     HPI Andrew Woodard is here for a physical exam.   He had his blood work done.   BP 128/78.    Syncope - once 4 months ago - after sleeping and got up and was reaching up - was lightheaded.  Occ gets lightheaded, dizziness when gets up quick or looks up.     He drinks a lot of water daily - at least 90 oz a day.     Diarrhea x 10 years.  Taking ibgard.  No blood, no black stool.  No abdominal pain or cramping.  Experiencing ED and would like to have medication for it.  Smoker-would like to quit-wants to try Chantix    Medications and allergies reviewed with patient and updated if appropriate.  Current Outpatient Medications on File Prior to Visit  Medication Sig Dispense Refill   cyclobenzaprine (FLEXERIL) 10 MG tablet Take 1 tablet (10 mg total) by mouth 3 (three) times daily as needed for muscle spasms. 30 tablet 0   fluocinolone (VANOS) 0.01 % cream Apply topically 2 (two) times daily. 60 g 1   hydrochlorothiazide (HYDRODIURIL) 25 MG tablet Take 1/2 (one-half) tablet by mouth once daily 45 tablet 3   lisinopril (ZESTRIL) 40 MG tablet Take 1 tablet (40 mg total) by mouth daily. 90 tablet 3   meloxicam (MOBIC) 15 MG tablet Take 1 tablet (15 mg total) by mouth daily. 90 tablet 3   metoprolol succinate (TOPROL-XL) 50 MG 24 hr tablet Take with or immediately following a meal. 90 tablet 3   sertraline (ZOLOFT) 100 MG tablet Take 1 tablet (100 mg total) by mouth daily. 90 tablet 3   No current facility-administered medications on file prior to visit.    Review of Systems  Constitutional:  Negative for chills and fever.  Eyes:  Positive for visual disturbance (has cataracts).  Respiratory:  Positive for cough (smokers cough) and wheezing (occ per wife). Negative for shortness of breath.   Cardiovascular:  Negative for chest pain, palpitations and leg swelling.  Gastrointestinal:   Positive for diarrhea (chronic). Negative for abdominal pain, blood in stool, constipation and nausea.       No gerd  Genitourinary:  Negative for difficulty urinating, dysuria and hematuria.  Musculoskeletal:  Negative for arthralgias and back pain.  Skin:  Negative for rash.  Neurological:  Positive for light-headedness. Negative for headaches.  Psychiatric/Behavioral:  Positive for dysphoric mood. The patient is nervous/anxious.        Objective:   Vitals:   11/29/21 1117  BP: 118/64  Pulse: 60  Temp: 98 F (36.7 C)  SpO2: 96%   Filed Weights   11/29/21 1117  Weight: 230 lb (104.3 kg)   Body mass index is 33 kg/m.  BP Readings from Last 3 Encounters:  11/29/21 118/64  11/28/20 112/80  07/27/20 126/82    Wt Readings from Last 3 Encounters:  11/29/21 230 lb (104.3 kg)  11/28/20 234 lb (106.1 kg)  07/27/20 235 lb (106.6 kg)      Physical Exam Constitutional: He appears well-developed and well-nourished. No distress.  HENT:  Head: Normocephalic and atraumatic.  Right Ear: External ear normal.  Left Ear: External ear normal.  Mouth/Throat: Oropharynx is clear and moist.  Normal ear canals and TM b/l  Eyes: Conjunctivae and EOM are normal.  Neck: Neck supple. No tracheal deviation present. No thyromegaly  present.  No carotid bruit  Cardiovascular: Normal rate, regular rhythm, normal heart sounds and intact distal pulses.   No murmur heard. Pulmonary/Chest: Effort normal and breath sounds normal. No respiratory distress. He has no wheezes. He has no rales.  Abdominal: Soft. He exhibits no distension. There is no tenderness.  Genitourinary: deferred  Musculoskeletal: He exhibits no edema.  Lymphadenopathy:   He has no cervical adenopathy.  Skin: Skin is warm and dry. He is not diaphoretic.  Psychiatric: He has a normal mood and affect. His behavior is normal.         Assessment & Plan:   Physical exam: Screening blood work  reviewed Exercise   walks dog  at least a mile daily, golf, yard work - at least 8000 steps a day Weight  advised weight loss Substance abuse   none   Reviewed recommended immunizations.   Health Maintenance  Topic Date Due   Medicare Annual Wellness (AWV)  01/05/2022   COVID-19 Vaccine (3 - Pfizer risk series) 12/15/2021 (Originally 04/22/2019)   TETANUS/TDAP  11/30/2022 (Originally 06/08/2018)   COLONOSCOPY (Pts 45-74yr Insurance coverage will need to be confirmed)  06/05/2022   Pneumonia Vaccine 69 Years old  Completed   INFLUENZA VACCINE  Completed   Hepatitis C Screening  Completed   Zoster Vaccines- Shingrix  Completed   HPV VACCINES  Aged Out     See Problem List for Assessment and Plan of chronic medical problems.

## 2021-11-29 ENCOUNTER — Ambulatory Visit (INDEPENDENT_AMBULATORY_CARE_PROVIDER_SITE_OTHER): Payer: Medicare HMO | Admitting: Internal Medicine

## 2021-11-29 ENCOUNTER — Encounter: Payer: Self-pay | Admitting: Internal Medicine

## 2021-11-29 VITALS — BP 118/64 | HR 60 | Temp 98.0°F | Ht 70.0 in | Wt 230.0 lb

## 2021-11-29 DIAGNOSIS — Z0000 Encounter for general adult medical examination without abnormal findings: Secondary | ICD-10-CM | POA: Diagnosis not present

## 2021-11-29 DIAGNOSIS — G8929 Other chronic pain: Secondary | ICD-10-CM | POA: Diagnosis not present

## 2021-11-29 DIAGNOSIS — I10 Essential (primary) hypertension: Secondary | ICD-10-CM

## 2021-11-29 DIAGNOSIS — M150 Primary generalized (osteo)arthritis: Secondary | ICD-10-CM

## 2021-11-29 DIAGNOSIS — R69 Illness, unspecified: Secondary | ICD-10-CM | POA: Diagnosis not present

## 2021-11-29 DIAGNOSIS — R7303 Prediabetes: Secondary | ICD-10-CM

## 2021-11-29 DIAGNOSIS — E782 Mixed hyperlipidemia: Secondary | ICD-10-CM

## 2021-11-29 DIAGNOSIS — N529 Male erectile dysfunction, unspecified: Secondary | ICD-10-CM | POA: Diagnosis not present

## 2021-11-29 DIAGNOSIS — F17210 Nicotine dependence, cigarettes, uncomplicated: Secondary | ICD-10-CM

## 2021-11-29 DIAGNOSIS — R197 Diarrhea, unspecified: Secondary | ICD-10-CM

## 2021-11-29 DIAGNOSIS — M159 Polyosteoarthritis, unspecified: Secondary | ICD-10-CM | POA: Diagnosis not present

## 2021-11-29 DIAGNOSIS — M545 Low back pain, unspecified: Secondary | ICD-10-CM | POA: Diagnosis not present

## 2021-11-29 DIAGNOSIS — Z23 Encounter for immunization: Secondary | ICD-10-CM | POA: Diagnosis not present

## 2021-11-29 DIAGNOSIS — F3289 Other specified depressive episodes: Secondary | ICD-10-CM | POA: Diagnosis not present

## 2021-11-29 DIAGNOSIS — K529 Noninfective gastroenteritis and colitis, unspecified: Secondary | ICD-10-CM

## 2021-11-29 DIAGNOSIS — R58 Hemorrhage, not elsewhere classified: Secondary | ICD-10-CM | POA: Insufficient documentation

## 2021-11-29 MED ORDER — SERTRALINE HCL 100 MG PO TABS: 100.0000 mg | ORAL_TABLET | Freq: Every day | ORAL | 3 refills | Status: DC

## 2021-11-29 MED ORDER — VARENICLINE TARTRATE (STARTER) 0.5 MG X 11 & 1 MG X 42 PO TBPK: ORAL_TABLET | ORAL | 0 refills | Status: DC

## 2021-11-29 MED ORDER — TADALAFIL 20 MG PO TABS: 10.0000 mg | ORAL_TABLET | ORAL | 11 refills | Status: DC | PRN

## 2021-11-29 MED ORDER — MELOXICAM 15 MG PO TABS: 15.0000 mg | ORAL_TABLET | Freq: Every day | ORAL | 3 refills | Status: DC

## 2021-11-29 MED ORDER — METOPROLOL SUCCINATE ER 50 MG PO TB24: 50.0000 mg | ORAL_TABLET | Freq: Every day | ORAL | 3 refills | Status: DC

## 2021-11-29 MED ORDER — LISINOPRIL 40 MG PO TABS: 40.0000 mg | ORAL_TABLET | Freq: Every day | ORAL | 3 refills | Status: DC

## 2021-11-29 NOTE — Assessment & Plan Note (Signed)
Chronic Would like to try Chantix He has never taken this previously Discussed possible side effects Start starter pack for the first month-if does okay with it he will let me know and can then send in continuation pack-recommended to take this for 3 months Work on changing habits

## 2021-11-29 NOTE — Assessment & Plan Note (Signed)
Chronic Controlled, Stable Continue sertraline 100 mg daily 

## 2021-11-29 NOTE — Assessment & Plan Note (Signed)
Chronic Regular exercise and healthy diet encouraged Check lipid panel  Currently lifestyle controlled He has deferred statin in the past He is high risk for cardiovascular disease We will get CT coronary calcium score and most likely will need to consider statin after that

## 2021-11-29 NOTE — Assessment & Plan Note (Signed)
Chronic Check a1c Low sugar / carb diet Stressed regular exercise  

## 2021-11-29 NOTE — Assessment & Plan Note (Signed)
Acute He would like to try Cialis or Viagra Discussed both We will try Cialis 10-20 mg daily as needed If this is not effective can try Viagra

## 2021-11-29 NOTE — Assessment & Plan Note (Signed)
Chronic Taking meloxicam 15 mg daily, which does help Continue

## 2021-11-29 NOTE — Assessment & Plan Note (Signed)
Chronic Blood pressure well controlled, but sounds like he may be having some orthostatic hypotension at times Discontinue hydrochlorothiazide 12.5 mg daily Monitor blood pressure at home CMP Continue lisinopril 40 mg daily, metoprolol XL 50 mg daily

## 2021-11-29 NOTE — Assessment & Plan Note (Signed)
Chronic Heavy diarrhea for over 10 years No abdominal pain, blood in the stool, black stool Taking IBgard without much improvement Does impact his quality of life ?  IBS-D Refer to GI Colonoscopy due 05/2022

## 2021-11-29 NOTE — Assessment & Plan Note (Signed)
Chronic Intermittent Taking Flexeril as needed, but has not taken this recently Okay to continue as needed only

## 2021-12-05 ENCOUNTER — Encounter: Payer: Self-pay | Admitting: Internal Medicine

## 2021-12-26 ENCOUNTER — Telehealth: Payer: Self-pay | Admitting: Internal Medicine

## 2021-12-26 NOTE — Telephone Encounter (Signed)
Left message for patient to call back to schedule Medicare Annual Wellness Visit   Last AWV  01/05/21  Please schedule at anytime with [facility] Wanda if patient calls the office back.      Any questions, please call me at [phone]

## 2021-12-28 ENCOUNTER — Encounter: Payer: Self-pay | Admitting: Internal Medicine

## 2021-12-30 MED ORDER — VARENICLINE TARTRATE 1 MG PO TABS: 1.0000 mg | ORAL_TABLET | Freq: Two times a day (BID) | ORAL | 1 refills | Status: DC

## 2022-01-15 ENCOUNTER — Encounter (HOSPITAL_BASED_OUTPATIENT_CLINIC_OR_DEPARTMENT_OTHER): Payer: Self-pay

## 2022-01-15 ENCOUNTER — Ambulatory Visit (HOSPITAL_BASED_OUTPATIENT_CLINIC_OR_DEPARTMENT_OTHER)
Admission: RE | Admit: 2022-01-15 | Discharge: 2022-01-15 | Disposition: A | Discharge status: Home / Self Care | Payer: Medicare HMO | Source: Ambulatory Visit | Attending: Internal Medicine | Admitting: Internal Medicine

## 2022-01-15 DIAGNOSIS — E782 Mixed hyperlipidemia: Secondary | ICD-10-CM | POA: Insufficient documentation

## 2022-01-15 DIAGNOSIS — I10 Essential (primary) hypertension: Secondary | ICD-10-CM | POA: Insufficient documentation

## 2022-01-16 ENCOUNTER — Encounter: Payer: Self-pay | Admitting: Internal Medicine

## 2022-01-16 ENCOUNTER — Other Ambulatory Visit: Payer: Self-pay | Admitting: Internal Medicine

## 2022-01-16 DIAGNOSIS — E782 Mixed hyperlipidemia: Secondary | ICD-10-CM

## 2022-01-16 DIAGNOSIS — R931 Abnormal findings on diagnostic imaging of heart and coronary circulation: Secondary | ICD-10-CM

## 2022-01-18 MED ORDER — ROSUVASTATIN CALCIUM 20 MG PO TABS: 20.0000 mg | ORAL_TABLET | Freq: Every day | ORAL | 3 refills | Status: DC

## 2022-01-23 ENCOUNTER — Ambulatory Visit: Payer: Medicare HMO | Admitting: Internal Medicine

## 2022-01-23 ENCOUNTER — Other Ambulatory Visit: Payer: Medicare HMO

## 2022-01-23 ENCOUNTER — Encounter: Payer: Self-pay | Admitting: Internal Medicine

## 2022-01-23 VITALS — BP 110/76 | HR 68 | Ht 70.0 in | Wt 232.0 lb

## 2022-01-23 DIAGNOSIS — R197 Diarrhea, unspecified: Secondary | ICD-10-CM | POA: Diagnosis not present

## 2022-01-23 DIAGNOSIS — R152 Fecal urgency: Secondary | ICD-10-CM | POA: Diagnosis not present

## 2022-01-23 DIAGNOSIS — Z1211 Encounter for screening for malignant neoplasm of colon: Secondary | ICD-10-CM

## 2022-01-23 NOTE — Progress Notes (Signed)
HISTORY OF PRESENT ILLNESS:  Andrew Woodard is a 72 y.o. male, golf enthusiast as well as former employee and acquaintance of Jerilee Hoh, who is sent today by his primary care provider regarding chronic problems with diarrhea and intermittent urgency with incontinence.  General medical problems as listed below.  I last saw the patient May 2014 when he underwent screening colonoscopy.  This was negative except for diverticulosis.  His index examination in 2006 was also negative for neoplasia.  Follow-up in 10 years recommended.  The patient tells me that he has had a 20-year history of problems with diarrhea.  Generally has 3 or 4 loose bowel movements within a short period of time in the mornings.  May have a few other loose bowel movements at other times during the day, often after having a glass of wine at 4 PM.  He denies abdominal pain, bleeding, or unexplained weight loss.  He feels like he may have irritable bowel syndrome.  He is treated himself with IBgard.  Not certain how helpful this has been.  He has not tried antidiarrheals.  He has not been tested for celiac disease, that he is aware.  Does describe having had episodes of severe urgency.  Once, he had to defecate behind a [place] tree when he was out walking his dog and urgency hit.  He was evaluated by his primary care provider November 29, 2021.  Reviewed.  Blood work from November 27, 2021 shows unremarkable comprehensive metabolic panel except for sodium of 132.  Normal CBC with hemoglobin 15.2.  Elevated MCV at 104.3.  Normal platelets.  Normal TSH of 1.3.  Hemoglobin A1c 6.0.  Normal eosinophils.  No new medications.  REVIEW OF SYSTEMS:  All non-GI ROS negative unless otherwise stated in HPI except for depression  Past Medical History:  Diagnosis Date   ADD (attention deficit disorder)    Allergy    Arthritis    Colon polyps    Depression    Fasting hyperglycemia    Hyperlipidemia    Hypertension    Pneumonia     Past  Surgical History:  Procedure Laterality Date   ABSCESS DRAINAGE  2012   L thigh, Dr Excell Seltzer (not MRSA)   COLONOSCOPY  2014   colonoscopy with polypectomy  2007   Dr Henrene Pastor   CYSTECTOMY  2011   L knee, Dr French Ana   [facility]  2013    Dr Ernestina Patches   ganglionectomy     PILONIDAL CYST EXCISION     X 2   VASECTOMY     X 2; Dr Karsten Ro    Social History [name]  reports that he has been smoking cigarettes. He has been smoking an average of 1 pack per day. He has never used smokeless tobacco. He reports current alcohol use. He reports that he does not use drugs.  family history includes Alzheimer's disease in his mother; Colon cancer (age of onset: 45) in his maternal grandmother; Depression in his father; Heart attack (age of onset: 12) in his maternal grandfather and paternal grandfather; Kidney disease in his father.  Allergies  Allergen Reactions   Penicillins     Throat swelled   Wellbutrin [Bupropion]     Felt bad on it       PHYSICAL EXAMINATION: Vital signs: BP 110/76   Pulse 68   Ht '5\' 10"'$  (1.778 m)   Wt 232 lb (105.2 kg)   BMI 33.29 kg/m   Constitutional: generally well-appearing, no acute distress Psychiatric:  alert and oriented x3, cooperative Eyes: extraocular movements intact, anicteric, conjunctiva pink Mouth: oral pharynx moist, no lesions Neck: supple no lymphadenopathy Cardiovascular: heart regular rate and rhythm, no murmur Lungs: clear to auscultation bilaterally Abdomen: soft, nontender, nondistended, no obvious ascites, no peritoneal signs, normal bowel sounds, no organomegaly Rectal: Deferred until colonoscopy Extremities: no clubbing, cyanosis, or lower extremity edema bilaterally Skin: no lesions on visible extremities Neuro: No focal deficits.  Cranial nerves intact  ASSESSMENT:  1.  Chronic problems with diarrhea and urgency as described.  May be irritable bowel syndrome.  Rule out celiac disease.  Rule out microscopic colitis. 2.  Colon cancer  screening.  Negative for neoplasia colonoscopy examinations 2006 2014.  Due for follow-up screening 3.  General medical problems.  Stable  PLAN:  1.  Serologies screening for celiac disease including tissue transglutaminase antibody IgA 2.  Advised to try low-dose Imodium as needed for diarrhea 3.  Schedule screening colonoscopy.  As well biopsies to rule out microscopic colitis.The nature of the procedure, as well as the risks, benefits, and alternatives were carefully and thoroughly reviewed with the patient. Ample time for discussion and questions allowed. The patient understood, was satisfied, and agreed to proceed.  Total time of 60 minutes was spent preparing to see the patient, reviewing outside data and records, obtaining comprehensive history, performing medically appropriate physical examination, counseling and educating the patient regarding the above listed issues, ordering blood work, recommending medication, ordering endoscopic procedure, and documenting clinical information in the health record.  Docia Chuck. Geri Seminole., M.D. [facility] Division of Gastroenterology

## 2022-01-23 NOTE — Patient Instructions (Signed)
_______________________________________________________  If you are age 70 or older, your body mass index should be between 23-30. Your Body mass index is 33.29 kg/m. If this is out of the aforementioned range listed, please consider follow up with your Primary Care Provider.  If you are age 35 or younger, your body mass index should be between 19-25. Your Body mass index is 33.29 kg/m. If this is out of the aformentioned range listed, please consider follow up with your Primary Care Provider.   ________________________________________________________  The [facility] providers would like to encourage you to use [facility] to communicate with providers for non-urgent requests or questions.  Due to long hold times on the telephone, sending your provider a message by [facility] may be a faster and more efficient way to get a response.  Please allow 48 business hours for a response.  Please remember that this is for non-urgent requests.  _______________________________________________________  Your provider has requested that you go to the basement level for lab work before leaving today. Press "B" on the elevator. The lab is located at the first door on the left as you exit the elevator.  You may use Imodium as needed

## 2022-01-24 LAB — TISSUE TRANSGLUTAMINASE, IGA: (tTG) Ab, IgA: <1 U/mL

## 2022-01-24 LAB — IGA: Immunoglobulin A: 256 mg/dL (ref 70–320)

## 2022-01-31 ENCOUNTER — Ambulatory Visit: Payer: Medicare HMO | Attending: Internal Medicine | Admitting: Internal Medicine

## 2022-01-31 ENCOUNTER — Encounter: Payer: Self-pay | Admitting: Internal Medicine

## 2022-01-31 VITALS — BP 110/60 | HR 58 | Ht 70.0 in | Wt 236.8 lb

## 2022-01-31 DIAGNOSIS — E782 Mixed hyperlipidemia: Secondary | ICD-10-CM | POA: Diagnosis not present

## 2022-01-31 DIAGNOSIS — R931 Abnormal findings on diagnostic imaging of heart and coronary circulation: Secondary | ICD-10-CM

## 2022-01-31 DIAGNOSIS — I10 Essential (primary) hypertension: Secondary | ICD-10-CM | POA: Diagnosis not present

## 2022-01-31 NOTE — Progress Notes (Signed)
Cardiology Office Note:    Date:  01/31/2022   ID:  Andrew Woodard, DOB [birth year], MRN [id number]  PCP:  Binnie Rail, MD  Cardiologist:  None  Electrophysiologist:  None   Referring MD: Binnie Rail, MD   Chief Complaint/Reason for Referral: Elevated coronary Ca score  History of Present Illness:    Andrew Woodard is a 72 y.o. male with a history of hypertension, hyperlipidemia, prediabetes, pneumonia, arthritis, ADD, and depression, here for the evaluation of elevated coronary calcium score.  He saw his PCP Dr. Quay Burow on 11/29/2021 for his annual physical. He was noted to have a syncopal episode 4 months prior. After waking up he got out of bed and was reaching up when he became lightheaded and passed out. His blood pressure was generally well controlled but it was thought he may be having some orthostatic hypotension at times. HCTZ was discontinued; remained on lisinopril and metoprolol. Has had chronic diarrhea for 10 years. Taking ibgard without much improvement, referred to GI. His regimen includes sertraline for depression, and meloxicam for osteoarthritis. He is a smoker but wanted to quit; he was given Chantix. This is working very well.  Due to high risk for cardiovascular disease he was referred for CT calcium score. On 01/15/22 his CT revealed a coronary calcium score of 2253 (Left main 55, LAD 967, LCx 314, RCA 917), borderline aortic dilatation at 38 mm (non-contrast) likely upper normal for age and BSA, and aortic atherosclerosis. His PCP started him on 20 mg rosuvastatin and 81 mg aspirin.  Today, he states that he generally feels good. We reviewed the results of his CT scan in detail.  A few times he has felt dizzy, mostly related to positional changes. He confirms one syncopal episode resulting in a fall this past Summer. At the time he had jumped up quickly from a seated position and reached up with his arm. He then passed out. No palpitations or irregular heart  beats.  He has been a smoker for over 50 years. However, he has been on Chantix for 3 months which he is tolerating well. He is down to 2-3 cigarettes a day from 40 cigarettes a day at his peak. His coughing and nasal congestion have significantly improved.   Regarding his diet, he frequently eats meat, now eats broccoli salad with chicken at lunch every day since CT, almonds all the time 90 oz water daily.  He enjoys playing golf, usually riding in the cart. Walks his dog a couple miles a day. May walk up to 9 miles a day, blow leaves, mows yard, makes compost.   Under family stressors without significant chest pain but stress does impact overall lifestyle goals.  In his family, both of his grandfathers had heart attacks and lived to their 67's. His mother had an ablation. He denies any personal history of atrial fibrillation.  The patient denies chest pain, chest pressure, dyspnea at rest or with exertion, palpitations, PND, orthopnea, or leg swelling. Denies fever, chills. Denies nausea, vomiting.   Past Medical History:  Diagnosis Date   ADD (attention deficit disorder)    Allergy    Arthritis    Colon polyps    Depression    Fasting hyperglycemia    Hyperlipidemia    Hypertension    Pneumonia     Past Surgical History:  Procedure Laterality Date   ABSCESS DRAINAGE  2012   L thigh, Dr Excell Seltzer (not MRSA)   COLONOSCOPY  2014   colonoscopy with  polypectomy  2007   Dr Henrene Pastor   CYSTECTOMY  2011   L knee, Dr French Ana   [facility]  2013    Dr Ernestina Patches   ganglionectomy     PILONIDAL CYST EXCISION     X 2   VASECTOMY     X 2; Dr Karsten Ro    Current Medications: Current Meds  Medication Sig   aspirin 81 MG chewable tablet Chew by mouth daily.   lisinopril (ZESTRIL) 40 MG tablet Take 1 tablet (40 mg total) by mouth daily.   meloxicam (MOBIC) 15 MG tablet Take 1 tablet (15 mg total) by mouth daily.   metoprolol succinate (TOPROL-XL) 50 MG 24 hr tablet Take 1 tablet (50 mg total) by  mouth daily. Take with or immediately following a meal.   rosuvastatin (CRESTOR) 20 MG tablet Take 1 tablet (20 mg total) by mouth daily.   sertraline (ZOLOFT) 100 MG tablet Take 1 tablet (100 mg total) by mouth daily.   tadalafil (CIALIS) 20 MG tablet Take 0.5-1 tablets (10-20 mg total) by mouth every other day as needed for erectile dysfunction.   varenicline (CHANTIX CONTINUING MONTH PAK) 1 MG tablet Take 1 tablet (1 mg total) by mouth 2 (two) times daily.     Allergies:   Penicillins and Wellbutrin [bupropion]   Social History   Tobacco Use   Smoking status: Every Day    Packs/day: 1.00    Types: Cigarettes   Smokeless tobacco: Never   Tobacco comments:    smokedage18- present , up to 2 ppd for 10 years. 08/09/14 6-7 cigarettes per day.  Substance Use Topics   Alcohol use: Yes    Comment: 28-32  glasses/ week of wine   Drug use: No     Family History: The patient's family history includes Alzheimer's disease in his mother; Colon cancer (age of onset: 56) in his maternal grandmother; Depression in his father; Heart attack (age of onset: 48) in his maternal grandfather and paternal grandfather; Kidney disease in his father. There is no history of Stroke, Diabetes, Esophageal cancer, Rectal cancer, or Stomach cancer.  ROS:   Please see the history of present illness.    (+) Dizziness (+) Isolated syncopal episode (+) Stress All other systems reviewed and are negative.  EKGs/Labs/Other Studies Reviewed:    The following studies were reviewed today:  CT Calcium Scoring  01/15/2022: FINDINGS: Coronary arteries: Normal origins.   Coronary Calcium Score:   Left main: 55   Left anterior descending artery: 967   Left circumflex artery: 314   Right coronary artery: 917   Total: 2253   Percentile: 95th   Pericardium: Normal.   Aorta: Borderline dilated at 38 mm (non-contrast). Aortic atherosclerosis.   Non-cardiac: See separate report from [facility].    IMPRESSION: 1. Coronary calcium score of 2253. This was 95th percentile for age-, race-, and sex-matched controls. 2. Aorta: Borderline dilated at 38 mm (non-contrast). Aortic atherosclerosis.  CT Chest  06/03/2012: IMPRESSION:  1. Density on chest radiography likely reflects incidental  localized pleural/diaphragmatic thickening anteriorly along the  costophrenic sulcus.  Similar appearance on contralateral side  argues against this being significant.  2.  3 mm right lower lobe pulmonary nodule. If the patient is at  high risk for bronchogenic carcinoma, follow-up chest CT at 1 year  is recommended.  If the patient is at low risk, no follow-up is  needed.  This recommendation follows the consensus statement:  Guidelines for Management of Small Pulmonary Nodules Detected  on CT  Scans:  A Statement from the [place] as published in  Radiology 2005; 237:395-400.  3. Benign appearing subpleural lymph node along the right major  fissure.  4. Atherosclerosis and paraseptal emphysema.   EKG:  EKG is personally reviewed. 01/31/2022: Sinus bradycardia. Early repolarization in the inferior leads. Rate 58 bpm.  Imaging studies that I have independently reviewed today: CT cor cal reviewed with patient in the room.  Recent Labs: 11/27/2021: ALT 26; Hemoglobin 15.2; Platelets 236.0; TSH 1.13 01/31/2022: BUN 33; Creatinine, Ser 1.17; Potassium 4.5; Sodium 137   Recent Lipid Panel    Component Value Date/Time   CHOL 181 11/27/2021 0733   TRIG 100.0 11/27/2021 0733   HDL 55.60 11/27/2021 0733   CHOLHDL 3 11/27/2021 0733   VLDL 20.0 11/27/2021 0733   LDLCALC 105 (H) 11/27/2021 0733   LDLDIRECT 125.1 01/01/2007 0000    Physical Exam:    VS:  BP 110/60   Pulse (!) 58   Ht '5\' 10"'$  (1.778 m)   Wt 236 lb 12.8 oz (107.4 kg)   SpO2 95%   BMI 33.98 kg/m     Wt Readings from Last 5 Encounters:  01/31/22 236 lb 12.8 oz (107.4 kg)  01/23/22 232 lb (105.2 kg)  11/29/21 230 lb  (104.3 kg)  11/28/20 234 lb (106.1 kg)  07/27/20 235 lb (106.6 kg)    Constitutional: No acute distress Eyes: sclera non-icteric, normal conjunctiva and lids ENMT: normal dentition, moist mucous membranes Cardiovascular: regular rhythm, bradycardic, no murmur. S1 and S2 normal. No jugular venous distention.  Respiratory: clear to auscultation bilaterally GI : normal bowel sounds, soft and nontender. No distention.   MSK: extremities warm, well perfused. No edema.  NEURO: grossly nonfocal exam, moves all extremities. PSYCH: alert and oriented x 3, normal mood and affect.   ASSESSMENT:    1. Elevated coronary artery calcium score   2. Essential hypertension   3. HYPERLIPIDEMIA    PLAN:    Elevated coronary artery calcium score - Plan: EKG 12-Lead, CT CORONARY MORPH W/CTA COR W/SCORE W/CA W/CM &/OR WO/CM, Basic metabolic panel  Essential hypertension  HYPERLIPIDEMIA  - noted to have very high calcium score 2253 with family history and in setting of long history of smoking. Will plan for coronary CTA for high risk calcium score (>1000).  - continue rosuvastatin 20 mg daily, likely will need uptitration to 40 mg daily, goal LDL <70. - BP well controlled today on lisinopril 40 mg daily, metoprolol succ 50 mg daily.   Total time of encounter: 60 minutes total time of encounter, including 40 minutes spent in face-to-face patient care on the date of this encounter. This time includes coordination of care and counseling regarding above mentioned problem list. Remainder of non-face-to-face time involved reviewing chart documents/testing relevant to the patient encounter and documentation in the medical record. I have independently reviewed documentation from referring provider.   Cherlynn Kaiser, MD, [place]   Shared Decision Making/Informed Consent:       Medication Adjustments/Labs and Tests Ordered: Current medicines are reviewed at length with the patient  today.  Concerns regarding medicines are outlined above.   Orders Placed This Encounter  Procedures   CT CORONARY MORPH W/CTA COR W/SCORE W/CA W/CM &/OR WO/CM   Basic metabolic panel   EKG 02-VOZD   No orders of the defined types were placed in this encounter.  Patient Instructions  Medication Instructions:  No changes *If you need a  refill on your cardiac medications before your next appointment, please call your pharmacy*   Lab Work: BMET today If you have labs (blood work) drawn today and your tests are completely normal, you will receive your results only by: [place] (if you have MyChart) OR A paper copy in the mail If you have any lab test that is abnormal or we need to change your treatment, we will call you to review the results.   Testing/Procedures: Your physician has requested that you have cardiac CT. Cardiac computed tomography (CT) is a painless test that uses an x-ray machine to take clear, detailed pictures of your heart. For further information please visit [email or web address]. Please follow instruction sheet as given.     Follow-Up: At [facility], you and your health needs are our priority.  As part of our continuing mission to provide you with exceptional heart care, we have created designated Provider Care Teams.  These Care Teams include your primary Cardiologist (physician) and Advanced Practice Providers (APPs -  Physician Assistants and Nurse Practitioners) who all work together to provide you with the care you need, when you need it.  We recommend signing up for the patient portal called "MyChart".  Sign up information is provided on this After Visit Summary.  MyChart is used to connect with patients for Virtual Visits (Telemedicine).  Patients are able to view lab/test results, encounter notes, upcoming appointments, etc.  Non-urgent messages can be sent to your provider as well.   To learn more about what you can do with MyChart, go to  [email or web address].    Your next appointment:   Right after your CTA   4-6 weeks  Provider:   Dr Margaretann Loveless    Other Instructions   Your cardiac CT will be scheduled at one of the below locations:   [facility] [address] [place], [place] [address] [phone]  [place] [address] [place], [place] [address] [phone]  [facility] [place], [place] [address] [phone]  If scheduled at [facility], please arrive at the [facility] Entrance (Entrance C2) of [facility] 30 minutes prior to test start time. You can use the FREE valet parking offered at entrance C (encouraged to control the heart rate for the test)  Proceed to the [facility] Radiology Department (first floor) to check-in and test prep.  All radiology patients and guests should use entrance C2 at [facility], accessed from [facility], even though the hospital's physical address listed is [address]    If scheduled at [facility] or [facility], please arrive 15 mins early for check-in and test prep.   Please follow these instructions carefully (unless otherwise directed):  Hold all erectile dysfunction medications at least 3 days (72 hrs) prior to test. (Ie viagra, cialis, sildenafil, tadalafil, etc) We will administer nitroglycerin during this exam.   On the Night Before the Test: Be sure to Drink plenty of water. Do not consume any caffeinated/decaffeinated beverages or chocolate 12 hours prior to your test. Do not take any antihistamines 12 hours prior to your test.  On the Day of the Test: Drink plenty of water until 1 hour prior to the test. Do not eat any food 1 hour prior to test. You may take your regular medications prior to the test.  HOLD  Furosemide/Hydrochlorothiazide  morning of the test.  *For Clinical Staff only. Please instruct patient the following:* Heart Rate Medication Recommendations for Cardiac CT  Resting HR < 50 bpm  No medication  Resting HR 50-60 bpm and BP >110/50 mmHG   Consider Metoprolol tartrate 25 mg PO 90-120 min prior to scan  Resting HR 60-65 bpm and BP >110/50 mmHG  Metoprolol tartrate 50 mg PO 90-120 minutes prior to scan   Resting HR > 65 bpm and BP >110/50 mmHG  Metoprolol tartrate 100 mg PO 90-120 minutes prior to scan  Consider Ivabradine 10-15 mg PO or a calcium channel blocker for resting HR >60 bpm and contraindication to metoprolol tartrate  Consider Ivabradine 10-15 mg PO in combination with metoprolol tartrate for HR >80 bpm         After the Test: Drink plenty of water. After receiving IV contrast, you may experience a mild flushed feeling. This is normal. On occasion, you may experience a mild rash up to 24 hours after the test. This is not dangerous. If this occurs, you can take Benadryl 25 mg and increase your fluid intake. If you experience trouble breathing, this can be serious. If it is severe call 911 IMMEDIATELY. If it is mild, please call our office. If you take any of these medications: Glipizide/Metformin, Avandament, Glucavance, please do not take 48 hours after completing test unless otherwise instructed.  We will call to schedule your test 2-4 weeks out understanding that some insurance companies will need an authorization prior to the service being performed.   For non-scheduling related questions, please contact the cardiac imaging nurse navigator should you have any questions/concerns: Marchia Bond, Cardiac Imaging Nurse Navigator Gordy Clement, Cardiac Imaging Nurse Navigator [facility] Direct Office Dial: [phone]   For scheduling needs, including cancellations and rescheduling, please call [place], [phone].    I,Mathew  Stumpf,acting as a Education administrator for Elouise Munroe, MD.,have documented all relevant documentation on the behalf of Elouise Munroe, MD,as directed by  Elouise Munroe, MD while in the presence of Elouise Munroe, MD.  I, Elouise Munroe, MD, have reviewed all documentation for the visit on 01/31/2022. The documentation on today's date of service for the exam, diagnosis, procedures, and orders are all accurate and complete.

## 2022-01-31 NOTE — Patient Instructions (Addendum)
Medication Instructions:  No changes *If you need a refill on your cardiac medications before your next appointment, please call your pharmacy*   Lab Work: BMET today If you have labs (blood work) drawn today and your tests are completely normal, you will receive your results only by: [place] (if you have MyChart) OR A paper copy in the mail If you have any lab test that is abnormal or we need to change your treatment, we will call you to review the results.   Testing/Procedures: Your physician has requested that you have cardiac CT. Cardiac computed tomography (CT) is a painless test that uses an x-ray machine to take clear, detailed pictures of your heart. For further information please visit [email or web address]. Please follow instruction sheet as given.     Follow-Up: At [facility], you and your health needs are our priority.  As part of our continuing mission to provide you with exceptional heart care, we have created designated Provider Care Teams.  These Care Teams include your primary Cardiologist (physician) and Advanced Practice Providers (APPs -  Physician Assistants and Nurse Practitioners) who all work together to provide you with the care you need, when you need it.  We recommend signing up for the patient portal called "MyChart".  Sign up information is provided on this After Visit Summary.  MyChart is used to connect with patients for Virtual Visits (Telemedicine).  Patients are able to view lab/test results, encounter notes, upcoming appointments, etc.  Non-urgent messages can be sent to your provider as well.   To learn more about what you can do with MyChart, go to [email or web address].    Your next appointment:   Right after your CTA   4-6 weeks  Provider:   Dr Margaretann Loveless    Other Instructions   Your cardiac CT will be scheduled at one of the below locations:   [facility] [address] [place], [place] [address] [phone]  [place] [address] [place], [place] [address] [phone]  [facility], [place] [address] [phone]  If scheduled at [facility], please arrive at the [facility] and Children's Entrance (Entrance C2) of [facility] 30 minutes prior to test start time. You can use the FREE valet parking offered at entrance C (encouraged to control the heart rate for the test)  Proceed to the [facility] Radiology Department (first floor) to check-in and test prep.  All radiology patients and guests should use entrance C2 at [facility], accessed from [facility], even though the hospital's physical address listed is [address]    If scheduled at [facility] or [facility], please arrive 15 mins early for check-in and test prep.   Please follow these instructions carefully (unless otherwise directed):  Hold all erectile dysfunction medications at least 3 days (72 hrs) prior to test. (Ie viagra, cialis, sildenafil, tadalafil, etc) We will administer nitroglycerin during this exam.   On the Night Before the Test: Be sure to Drink plenty of water. Do not consume any caffeinated/decaffeinated beverages or chocolate 12 hours prior to your test. Do not take any antihistamines 12 hours prior to your test.  On the Day of the Test: Drink plenty of water until 1 hour prior to the test. Do not eat any food 1 hour prior to test. You may take your regular  medications prior to the test.  HOLD Furosemide/Hydrochlorothiazide morning of the test.  *For Clinical Staff only. Please instruct patient the following:* Heart Rate Medication Recommendations for Cardiac CT  Resting HR < 50 bpm  No medication  Resting HR 50-60 bpm and BP >110/50 mmHG   Consider Metoprolol tartrate 25 mg PO 90-120 min  prior to scan  Resting HR 60-65 bpm and BP >110/50 mmHG  Metoprolol tartrate 50 mg PO 90-120 minutes prior to scan   Resting HR > 65 bpm and BP >110/50 mmHG  Metoprolol tartrate 100 mg PO 90-120 minutes prior to scan  Consider Ivabradine 10-15 mg PO or a calcium channel blocker for resting HR >60 bpm and contraindication to metoprolol tartrate  Consider Ivabradine 10-15 mg PO in combination with metoprolol tartrate for HR >80 bpm         After the Test: Drink plenty of water. After receiving IV contrast, you may experience a mild flushed feeling. This is normal. On occasion, you may experience a mild rash up to 24 hours after the test. This is not dangerous. If this occurs, you can take Benadryl 25 mg and increase your fluid intake. If you experience trouble breathing, this can be serious. If it is severe call 911 IMMEDIATELY. If it is mild, please call our office. If you take any of these medications: Glipizide/Metformin, Avandament, Glucavance, please do not take 48 hours after completing test unless otherwise instructed.  We will call to schedule your test 2-4 weeks out understanding that some insurance companies will need an authorization prior to the service being performed.   For non-scheduling related questions, please contact the cardiac imaging nurse navigator should you have any questions/concerns: Marchia Bond, Cardiac Imaging Nurse Navigator Gordy Clement, Cardiac Imaging Nurse Navigator [facility] Direct Office Dial: [phone]   For scheduling needs, including cancellations and rescheduling, please call [place], [phone].

## 2022-02-01 ENCOUNTER — Encounter: Payer: Self-pay | Admitting: Internal Medicine

## 2022-02-01 LAB — BASIC METABOLIC PANEL
BUN/Creatinine Ratio: 28 — ABNORMAL HIGH (ref 10–24)
BUN: 33 mg/dL — ABNORMAL HIGH (ref 8–27)
CO2: 23 mmol/L (ref 20–29)
Calcium: 9.6 mg/dL (ref 8.6–10.2)
Chloride: 100 mmol/L (ref 96–106)
Creatinine, Ser: 1.17 mg/dL (ref 0.76–1.27)
Glucose: 99 mg/dL (ref 70–99)
Potassium: 4.5 mmol/L (ref 3.5–5.2)
Sodium: 137 mmol/L (ref 134–144)
eGFR: 67 mL/min/{1.73_m2} (ref >59)

## 2022-02-02 ENCOUNTER — Other Ambulatory Visit: Payer: Self-pay | Admitting: Internal Medicine

## 2022-02-06 ENCOUNTER — Telehealth (HOSPITAL_COMMUNITY): Payer: Self-pay | Admitting: *Deleted

## 2022-02-06 ENCOUNTER — Encounter: Payer: Self-pay | Admitting: Internal Medicine

## 2022-02-06 ENCOUNTER — Encounter (INDEPENDENT_AMBULATORY_CARE_PROVIDER_SITE_OTHER): Payer: Medicare HMO | Admitting: Internal Medicine

## 2022-02-06 DIAGNOSIS — I951 Orthostatic hypotension: Secondary | ICD-10-CM

## 2022-02-06 DIAGNOSIS — R55 Syncope and collapse: Secondary | ICD-10-CM

## 2022-02-06 NOTE — Telephone Encounter (Signed)
Reaching out to patient to offer assistance regarding upcoming cardiac imaging study; pt verbalizes understanding of appt date/time, parking situation and where to check in, pre-test NPO status  and verified current allergies; name and call back number provided for further questions should they arise ? ?Coraline Talwar RN Navigator Cardiac Imaging ?[facility] ?[phone] office ?[phone] cell ? ?

## 2022-02-07 ENCOUNTER — Other Ambulatory Visit: Payer: Self-pay | Admitting: Internal Medicine

## 2022-02-07 ENCOUNTER — Ambulatory Visit (HOSPITAL_COMMUNITY)
Admission: RE | Admit: 2022-02-07 | Discharge: 2022-02-07 | Disposition: A | Discharge status: Home / Self Care | Payer: Medicare HMO | Source: Ambulatory Visit | Attending: Internal Medicine | Admitting: Internal Medicine

## 2022-02-07 ENCOUNTER — Encounter: Payer: Self-pay | Admitting: Internal Medicine

## 2022-02-07 DIAGNOSIS — I951 Orthostatic hypotension: Secondary | ICD-10-CM | POA: Diagnosis not present

## 2022-02-07 DIAGNOSIS — E782 Mixed hyperlipidemia: Secondary | ICD-10-CM

## 2022-02-07 DIAGNOSIS — I10 Essential (primary) hypertension: Secondary | ICD-10-CM | POA: Diagnosis not present

## 2022-02-07 DIAGNOSIS — R931 Abnormal findings on diagnostic imaging of heart and coronary circulation: Secondary | ICD-10-CM

## 2022-02-07 DIAGNOSIS — R55 Syncope and collapse: Secondary | ICD-10-CM | POA: Diagnosis present

## 2022-02-07 DIAGNOSIS — I700 Atherosclerosis of aorta: Secondary | ICD-10-CM | POA: Insufficient documentation

## 2022-02-07 DIAGNOSIS — E785 Hyperlipidemia, unspecified: Secondary | ICD-10-CM | POA: Insufficient documentation

## 2022-02-07 DIAGNOSIS — I2510 Atherosclerotic heart disease of native coronary artery without angina pectoris: Secondary | ICD-10-CM

## 2022-02-07 MED ORDER — NITROGLYCERIN 0.4 MG SL SUBL
SUBLINGUAL_TABLET | SUBLINGUAL | Status: AC
  Administered 2022-02-07: 0.8 mg via SUBLINGUAL
  Filled 2022-02-07: qty 2

## 2022-02-07 MED ORDER — IOHEXOL 350 MG/ML SOLN
100.0000 mL | Freq: Once | INTRAVENOUS | Status: AC | PRN
  Administered 2022-02-07: 100 mL via INTRAVENOUS

## 2022-02-07 MED ORDER — NITROGLYCERIN 0.4 MG SL SUBL
0.8000 mg | SUBLINGUAL_TABLET | Freq: Once | SUBLINGUAL | Status: AC
  Administered 2022-02-07: 0.8 mg via SUBLINGUAL

## 2022-02-07 NOTE — Telephone Encounter (Signed)
Please see the MyChart message reply(ies) for my assessment and plan.   The patient gave consent for this Medical Advice Message and is aware that it may result in a bill to their insurance company as well as the possibility that this may result in a co-payment or deductible. They are an established patient, but are not seeking medical advice exclusively about a problem treated during an in person or video visit in the last 7 days. I did not recommend an in person or video visit within 7 days of my reply.   I spent a total of 57mnutes cumulative time within 7 days through [place] MD

## 2022-02-08 ENCOUNTER — Ambulatory Visit (HOSPITAL_BASED_OUTPATIENT_CLINIC_OR_DEPARTMENT_OTHER)
Admission: RE | Admit: 2022-02-08 | Discharge: 2022-02-08 | Disposition: A | Discharge status: Home / Self Care | Payer: Medicare HMO | Source: Ambulatory Visit | Attending: Internal Medicine | Admitting: Internal Medicine

## 2022-02-08 DIAGNOSIS — I10 Essential (primary) hypertension: Secondary | ICD-10-CM | POA: Diagnosis not present

## 2022-02-08 DIAGNOSIS — R931 Abnormal findings on diagnostic imaging of heart and coronary circulation: Secondary | ICD-10-CM | POA: Diagnosis not present

## 2022-02-08 DIAGNOSIS — I2510 Atherosclerotic heart disease of native coronary artery without angina pectoris: Secondary | ICD-10-CM | POA: Diagnosis not present

## 2022-02-08 DIAGNOSIS — R55 Syncope and collapse: Secondary | ICD-10-CM | POA: Diagnosis not present

## 2022-02-08 DIAGNOSIS — E785 Hyperlipidemia, unspecified: Secondary | ICD-10-CM | POA: Diagnosis not present

## 2022-02-08 DIAGNOSIS — I700 Atherosclerosis of aorta: Secondary | ICD-10-CM | POA: Diagnosis not present

## 2022-02-08 LAB — ECHOCARDIOGRAM COMPLETE
Area-P 1/2: 2.24 cm2
S' Lateral: 2.9 cm

## 2022-02-08 MED ORDER — ROSUVASTATIN CALCIUM 40 MG PO TABS: 40.0000 mg | ORAL_TABLET | Freq: Every day | ORAL | 3 refills | Status: DC

## 2022-02-08 MED ORDER — PERFLUTREN LIPID MICROSPHERE
1.0000 mL | INTRAVENOUS | Status: AC | PRN
  Administered 2022-02-08: 4 mL via INTRAVENOUS

## 2022-02-08 MED ORDER — OXYCODONE HCL 5 MG PO TABS
ORAL_TABLET | ORAL | Status: AC
  Filled 2022-02-08: qty 1

## 2022-02-08 NOTE — Telephone Encounter (Signed)
Andrew Munroe, MD 02/07/2022  5:12 PM EST     Moderate, nonobstructive CAD in the mid RCA and diffusely in the LAD. Recommend medical management, increasing Crestor to 40 mg daily and recheck lipids/lfts in 3 mo. FFR was unremarkable, so no indication for invasive testing at this time. Please do get echocardiogram (I see it has been ordered) to follow up syncopal episode and evidence of right ventricular dilation on coronary CT.   Cc'ing Dr. Bettye Boeck with pt regarding results of coronary CTA. All questions answered. New prescription sent to preferred pharmacy. Lab orders placed. Pt verbalizes understanding.  Results forwarded to Dr. Quay Burow.

## 2022-02-14 DIAGNOSIS — L739 Follicular disorder, unspecified: Secondary | ICD-10-CM | POA: Diagnosis not present

## 2022-02-14 DIAGNOSIS — D1801 Hemangioma of skin and subcutaneous tissue: Secondary | ICD-10-CM | POA: Diagnosis not present

## 2022-02-14 DIAGNOSIS — R202 Paresthesia of skin: Secondary | ICD-10-CM | POA: Diagnosis not present

## 2022-02-14 DIAGNOSIS — L570 Actinic keratosis: Secondary | ICD-10-CM | POA: Diagnosis not present

## 2022-02-14 DIAGNOSIS — L700 Acne vulgaris: Secondary | ICD-10-CM | POA: Diagnosis not present

## 2022-02-14 DIAGNOSIS — L814 Other melanin hyperpigmentation: Secondary | ICD-10-CM | POA: Diagnosis not present

## 2022-02-14 DIAGNOSIS — L738 Other specified follicular disorders: Secondary | ICD-10-CM | POA: Diagnosis not present

## 2022-02-14 DIAGNOSIS — D229 Melanocytic nevi, unspecified: Secondary | ICD-10-CM | POA: Diagnosis not present

## 2022-02-14 DIAGNOSIS — L578 Other skin changes due to chronic exposure to nonionizing radiation: Secondary | ICD-10-CM | POA: Diagnosis not present

## 2022-02-14 DIAGNOSIS — L821 Other seborrheic keratosis: Secondary | ICD-10-CM | POA: Diagnosis not present

## 2022-02-21 ENCOUNTER — Encounter: Payer: Self-pay | Admitting: Internal Medicine

## 2022-02-25 ENCOUNTER — Other Ambulatory Visit: Payer: Self-pay

## 2022-02-25 MED ORDER — LISINOPRIL 20 MG PO TABS: 20.0000 mg | ORAL_TABLET | Freq: Every day | ORAL | 3 refills | Status: DC

## 2022-03-05 ENCOUNTER — Other Ambulatory Visit: Payer: Self-pay | Admitting: Internal Medicine

## 2022-03-06 ENCOUNTER — Other Ambulatory Visit: Payer: Self-pay

## 2022-03-06 ENCOUNTER — Encounter: Payer: Self-pay | Admitting: Internal Medicine

## 2022-03-06 ENCOUNTER — Other Ambulatory Visit (HOSPITAL_COMMUNITY): Payer: Medicare HMO

## 2022-03-07 ENCOUNTER — Encounter: Payer: Self-pay | Admitting: Internal Medicine

## 2022-03-22 ENCOUNTER — Encounter: Payer: Self-pay | Admitting: Internal Medicine

## 2022-03-22 ENCOUNTER — Ambulatory Visit: Payer: Medicare HMO | Attending: Internal Medicine | Admitting: Internal Medicine

## 2022-03-22 VITALS — BP 116/75 | HR 55 | Ht 70.0 in | Wt 239.8 lb

## 2022-03-22 DIAGNOSIS — I25119 Atherosclerotic heart disease of native coronary artery with unspecified angina pectoris: Secondary | ICD-10-CM | POA: Diagnosis not present

## 2022-03-22 DIAGNOSIS — R931 Abnormal findings on diagnostic imaging of heart and coronary circulation: Secondary | ICD-10-CM

## 2022-03-22 DIAGNOSIS — E782 Mixed hyperlipidemia: Secondary | ICD-10-CM

## 2022-03-22 DIAGNOSIS — I10 Essential (primary) hypertension: Secondary | ICD-10-CM | POA: Diagnosis not present

## 2022-03-22 DIAGNOSIS — R55 Syncope and collapse: Secondary | ICD-10-CM

## 2022-03-22 NOTE — Patient Instructions (Signed)
Medication Instructions:  No Changes In Medications at this time.  *If you need a refill on your cardiac medications before your next appointment, please call your pharmacy*  Lab Work:  Please return for FASTING Blood Work AT [place] . No appointment needed, lab here at the office is open Monday-Friday from 8AM to 4PM and closed daily for lunch from 12:45-1:45.   If you have labs (blood work) drawn today and your tests are completely normal, you will receive your results only by: [place] (if you have MyChart) OR A paper copy in the mail If you have any lab test that is abnormal or we need to change your treatment, we will call you to review the results.  Follow-Up: At [facility], you and your health needs are our priority.  As part of our continuing mission to provide you with exceptional heart care, we have created designated Provider Care Teams.  These Care Teams include your primary Cardiologist (physician) and Advanced Practice Providers (APPs -  Physician Assistants and Nurse Practitioners) who all work together to provide you with the care you need, when you need it.  Your next appointment:   6 month(s)  Provider:   Dr. Margaretann Loveless

## 2022-03-22 NOTE — Progress Notes (Addendum)
Cardiology Office Note:    Date:  03/22/2022   ID:  Andrew Woodard, DOB March 31, 1950, MRN [id number]  PCP:  Pincus Sanes, MD  Cardiologist:  None  Electrophysiologist:  None   Referring MD: Pincus Sanes, MD   Chief Complaint/Reason for Referral: Elevated coronary Ca score  History of Present Illness:    Andrew Woodard is a 72 y.o. male with a history of hypertension, hyperlipidemia, prediabetes, pneumonia, arthritis, ADD, and depression, here for the evaluation of elevated coronary calcium score.  Returns for review of testing. Wife present for visit. Provides collaborative history. CCTA with moderate nonobstructive CAD by FFR (personally interpreted). Echo with no source of syncope.   I reviewed heart healthy lifestyle and diet recommendations extensively.  The patient denies chest pain, chest pressure, dyspnea at rest or with exertion, palpitations, PND, orthopnea, or leg swelling. Denies cough, fever, chills. Denies nausea, vomiting. Denies interval syncope or presyncope. Denies dizziness or lightheadedness.   Prior visits: He saw his PCP Dr. Lawerance Bach on 11/29/2021 for his annual physical. He was noted to have a syncopal episode 4 months prior. After waking up he got out of bed and was reaching up when he became lightheaded and passed out. His blood pressure was generally well controlled but it was thought he may be having some orthostatic hypotension at times. HCTZ was discontinued; remained on lisinopril and metoprolol. Has had chronic diarrhea for 10 years. Taking ibgard without much improvement, referred to GI. His regimen includes sertraline for depression, and meloxicam for osteoarthritis. He is a smoker but wanted to quit; he was given Chantix. This is working very well.  Due to high risk for cardiovascular disease he was referred for CT calcium score. On 01/15/22 his CT revealed a coronary calcium score of 2253 (Left main 55, LAD 967, LCx 314, RCA 917), borderline aortic dilatation  at 38 mm (non-contrast) likely upper normal for age and BSA, and aortic atherosclerosis. His PCP started him on 20 mg rosuvastatin and 81 mg aspirin.  Today, he states that he generally feels good. We reviewed the results of his CT scan in detail.  A few times he has felt dizzy, mostly related to positional changes. He confirms one syncopal episode resulting in a fall this past Summer. At the time he had jumped up quickly from a seated position and reached up with his arm. He then passed out. No palpitations or irregular heart beats.  He has been a smoker for over 50 years. However, he has been on Chantix for 3 months which he is tolerating well. He is down to 2-3 cigarettes a day from 40 cigarettes a day at his peak. His coughing and nasal congestion have significantly improved.   Regarding his diet, he frequently eats meat, now eats broccoli salad with chicken at lunch every day since CT, almonds all the time 90 oz water daily.  He enjoys playing golf, usually riding in the cart. Walks his dog a couple miles a day. May walk up to 9 miles a day, blow leaves, mows yard, makes compost.   Under family stressors without significant chest pain but stress does impact overall lifestyle goals.  In his family, both of his grandfathers had heart attacks and lived to their 32's. His mother had an ablation. He denies any personal history of atrial fibrillation.  The patient denies chest pain, chest pressure, dyspnea at rest or with exertion, palpitations, PND, orthopnea, or leg swelling. Denies fever, chills. Denies nausea, vomiting.   Past Medical  History:  Diagnosis Date   ADD (attention deficit disorder)    Allergy    Arthritis    Colon polyps    Depression    Fasting hyperglycemia    Hyperlipidemia    Hypertension    Pneumonia     Past Surgical History:  Procedure Laterality Date   ABSCESS DRAINAGE  2012   L thigh, Dr Johna Sheriff (not MRSA)   COLONOSCOPY  2014   colonoscopy with  polypectomy  2007   Dr Marina Goodell   CYSTECTOMY  2011   L knee, Dr Madelon Lips   [facility]  2013    Dr Alvester Morin   ganglionectomy     PILONIDAL CYST EXCISION     X 2   VASECTOMY     X 2; Dr Vernie Ammons    Current Medications: Current Meds  Medication Sig   aspirin 81 MG chewable tablet Chew by mouth daily.   lisinopril (ZESTRIL) 20 MG tablet Take 1 tablet (20 mg total) by mouth daily.   meloxicam (MOBIC) 15 MG tablet Take 1 tablet (15 mg total) by mouth daily.   metoprolol succinate (TOPROL-XL) 50 MG 24 hr tablet Take 1 tablet (50 mg total) by mouth daily. Take with or immediately following a meal.   rosuvastatin (CRESTOR) 40 MG tablet Take 1 tablet (40 mg total) by mouth daily.   sertraline (ZOLOFT) 100 MG tablet Take 1 tablet (100 mg total) by mouth daily.   tadalafil (CIALIS) 20 MG tablet Take 0.5-1 tablets (10-20 mg total) by mouth every other day as needed for erectile dysfunction.   [DISCONTINUED] varenicline (CHANTIX) 1 MG tablet Take 1 tablet by mouth twice daily     Allergies:   Penicillins and Wellbutrin [bupropion]   Social History   Tobacco Use   Smoking status: Every Day    Packs/day: 1    Types: Cigarettes   Smokeless tobacco: Never   Tobacco comments:    smokedage18- present , up to 2 ppd for 10 years. 08/09/14 6-7 cigarettes per day.  Substance Use Topics   Alcohol use: Yes    Comment: 28-32  glasses/ week of wine   Drug use: No     Family History: The patient's family history includes Alzheimer's disease in his mother; Colon cancer (age of onset: 63) in his maternal grandmother; Depression in his father; Heart attack (age of onset: 84) in his maternal grandfather and paternal grandfather; Kidney disease in his father. There is no history of Stroke, Diabetes, Esophageal cancer, Rectal cancer, or Stomach cancer.  ROS:   Please see the history of present illness.    (+) Dizziness (+) Isolated syncopal episode (+) Stress All other systems reviewed and are  negative.  EKGs/Labs/Other Studies Reviewed:    The following studies were reviewed today:  CT Calcium Scoring  01/15/2022: FINDINGS: Coronary arteries: Normal origins.   Coronary Calcium Score:   Left main: 55   Left anterior descending artery: 967   Left circumflex artery: 314   Right coronary artery: 917   Total: 2253   Percentile: 95th   Pericardium: Normal.   Aorta: Borderline dilated at 38 mm (non-contrast). Aortic atherosclerosis.   Non-cardiac: See separate report from [facility].   IMPRESSION: 1. Coronary calcium score of 2253. This was 95th percentile for age-, race-, and sex-matched controls. 2. Aorta: Borderline dilated at 38 mm (non-contrast). Aortic atherosclerosis.  CT Chest  06/03/2012: IMPRESSION:  1. Density on chest radiography likely reflects incidental  localized pleural/diaphragmatic thickening anteriorly along the  costophrenic sulcus.  Similar appearance on contralateral side  argues against this being significant.  2.  3 mm right lower lobe pulmonary nodule. If the patient is at  high risk for bronchogenic carcinoma, follow-up chest CT at 1 year  is recommended.  If the patient is at low risk, no follow-up is  needed.  This recommendation follows the consensus statement:  Guidelines for Management of Small Pulmonary Nodules Detected on CT  Scans:  A Statement from the Fleischner Society as published in  Radiology 2005; 237:395-400.  3. Benign appearing subpleural lymph node along the right major  fissure.  4. Atherosclerosis and paraseptal emphysema.   EKG:  EKG is personally reviewed. 01/31/2022: Sinus bradycardia. Early repolarization in the inferior leads. Rate 58 bpm.  Imaging studies that I have independently reviewed today: CT cor cal reviewed with patient in the room. CCTA reviewed and noted in HPI  Recent Labs: 11/27/2021: Hemoglobin 15.2; Platelets 236.0; TSH 1.13 01/31/2022: BUN 33; Creatinine, Ser 1.17; Potassium  4.5; Sodium 137 04/26/2022: ALT 20   Recent Lipid Panel    Component Value Date/Time   CHOL 126 04/26/2022 0908   TRIG 71 04/26/2022 0908   HDL 67 04/26/2022 0908   CHOLHDL 1.9 04/26/2022 0908   CHOLHDL 3 11/27/2021 0733   VLDL 20.0 11/27/2021 0733   LDLCALC 45 04/26/2022 0908   LDLDIRECT 125.1 01/01/2007 0000    Physical Exam:    VS:  BP 116/75   Pulse (!) 55   Ht 5\' 10"  (1.778 m)   Wt 239 lb 12.8 oz (108.8 kg)   SpO2 96%   BMI 34.41 kg/m     Wt Readings from Last 5 Encounters:  03/22/22 239 lb 12.8 oz (108.8 kg)  01/31/22 236 lb 12.8 oz (107.4 kg)  01/23/22 232 lb (105.2 kg)  11/29/21 230 lb (104.3 kg)  11/28/20 234 lb (106.1 kg)    Constitutional: No acute distress Eyes: sclera non-icteric, normal conjunctiva and lids ENMT: normal dentition, moist mucous membranes Cardiovascular: regular rhythm, bradycardic, no murmur. S1 and S2 normal. No jugular venous distention.  Respiratory: clear to auscultation bilaterally GI : normal bowel sounds, soft and nontender. No distention.   MSK: extremities warm, well perfused. No edema.  NEURO: grossly nonfocal exam, moves all extremities. PSYCH: alert and oriented x 3, normal mood and affect.   ASSESSMENT:    1. Syncope, unspecified syncope type   2. Mixed hyperlipidemia   3. Elevated coronary artery calcium score   4. Essential hypertension   5. Coronary artery disease involving native coronary artery of native heart with angina pectoris (HCC)     PLAN:    Syncope, unspecified syncope type  Mixed hyperlipidemia  Elevated coronary artery calcium score  Essential hypertension  Coronary artery disease involving native coronary artery of native heart with angina pectoris (HCC)  - noted to have very high calcium score 2253 with family history and in setting of long history of smoking. CCTA with moderate nonobstructive CAD. Cont ASA 81 mg daily. - continue rosuvastatin 40 mg daily, goal LDL <70. Labs in 6 weeks. - BP  well controlled today on lisinopril 40 mg daily, metoprolol succ 50 mg daily.   Total time of encounter: 40 minutes total time of encounter, including 30 minutes spent in face-to-face patient care on the date of this encounter. This time includes coordination of care and counseling regarding above mentioned problem list. Remainder of non-face-to-face time involved reviewing chart documents/testing relevant to the patient encounter and documentation in the medical record.  I have independently reviewed documentation from referring provider.   Weston Brass, MD, [facility] [place]  [facility]    Shared Decision Making/Informed Consent:       Medication Adjustments/Labs and Tests Ordered: Current medicines are reviewed at length with the patient today.  Concerns regarding medicines are outlined above.   No orders of the defined types were placed in this encounter.  No orders of the defined types were placed in this encounter.  Patient Instructions  Medication Instructions:  No Changes In Medications at this time.  *If you need a refill on your cardiac medications before your next appointment, please call your pharmacy*  Lab Work:  Please return for FASTING Blood Work AT THE END OF MARCH . No appointment needed, lab here at the office is open Monday-Friday from 8AM to 4PM and closed daily for lunch from 12:45-1:45.   If you have labs (blood work) drawn today and your tests are completely normal, you will receive your results only by: MyChart Message (if you have MyChart) OR A paper copy in the mail If you have any lab test that is abnormal or we need to change your treatment, we will call you to review the results.  Follow-Up: At [facility], you and your health needs are our priority.  As part of our continuing mission to provide you with exceptional heart care, we have created designated Provider Care Teams.  These Care Teams include your primary Cardiologist (physician)  and Advanced Practice Providers (APPs -  Physician Assistants and Nurse Practitioners) who all work together to provide you with the care you need, when you need it.  Your next appointment:   6 month(s)  Provider:   Dr. Jacques Navy     ADDENDUM: 05/20/22  Findings on plaque report as noted below:  Labs: LDL 45, HDL 67, trig 71  Total plaque volume on coronary CTA 1449 mm3, this is categorized as extensive plaque volume, with 67% of plaque documented as soft plaque and 484% calcified plaque. 2% of soft plaque is low attenuation. Recommendation is to schedule visit with [facility] for consideration of PCSK9I therapy. -------------------------------------------- Patient had plaque data provided as part of DECIDE registry from coronary CTA, management decisions as follows:  Did you add a medication? No  If yes, how many? 0  If no, reason? Reason for not adding med: Other on high intensity statin  Did you remove a medication? No  Did you increase the dosage of any medication? No  Did you decrease the dosage of any medication? No  Did you refer to a specialist (i.e. lipid clinic, preventive cardiology, endocrinology)? Yes, [facility] lipid clinic  Has patient seen plaque report? No

## 2022-04-18 ENCOUNTER — Other Ambulatory Visit: Payer: Self-pay

## 2022-04-18 ENCOUNTER — Other Ambulatory Visit: Payer: Self-pay | Admitting: Internal Medicine

## 2022-04-26 DIAGNOSIS — E782 Mixed hyperlipidemia: Secondary | ICD-10-CM | POA: Diagnosis not present

## 2022-04-27 LAB — HEPATIC FUNCTION PANEL
ALT: 20 IU/L (ref 0–44)
AST: 20 IU/L (ref 0–40)
Albumin: 4.2 g/dL (ref 3.9–4.9)
Alkaline Phosphatase: 75 IU/L (ref 44–121)
Bilirubin Total: 0.5 mg/dL (ref 0.0–1.2)
Bilirubin, Direct: 0.15 mg/dL (ref 0.00–0.40)
Total Protein: 7 g/dL (ref 6.0–8.5)

## 2022-04-27 LAB — LIPID PANEL
Chol/HDL Ratio: 1.9 ratio (ref 0.0–5.0)
Cholesterol, Total: 126 mg/dL (ref 100–199)
HDL: 67 mg/dL (ref >39)
LDL Chol Calc (NIH): 45 mg/dL (ref 0–99)
Triglycerides: 71 mg/dL (ref 0–149)
VLDL Cholesterol Cal: 14 mg/dL (ref 5–40)

## 2022-05-21 ENCOUNTER — Telehealth: Payer: Self-pay

## 2022-05-21 DIAGNOSIS — E782 Mixed hyperlipidemia: Secondary | ICD-10-CM

## 2022-05-21 NOTE — Telephone Encounter (Signed)
-----   Message from Parke Poisson, MD sent at 05/20/2022  8:36 AM EDT ----- Plaque data from CCTA obtained as part of DECIDE registry. Please see addendum to progress note 03/22/22 for details.   Eileen Stanford, could you please call Mr. Runyon and let him know that additional information is available regarding the plaque found on his coronary CT scan. Based on that information, I would like for him to visit with [facility] lipid clinic to discuss additional medication options that will be a more aggressive way to address his plaque volume. Plaque volume is characterized as extensive, which we already knew based on his very high calcium score, however now that it has been formally analyzed, recommendations are to intensify therapy if able. He may choose to consider PCSK9I therapy or not, but this is something we should consider based on additional information.  Thanks, GA

## 2022-05-21 NOTE — Telephone Encounter (Signed)
Parke Poisson, MD  You58 minutes ago (3:00 PM)    Yes, I would him him to have a conversation with [facility] lipid clinic about plaque data we obtained and see if maybe PCSK9I is a better therapy for him than even statin. For now continue statin and meet with pharmacist.    Returned call to patient and made him aware of the above. Referral placed to PharmD  Advised patient to call back to office with any issues, questions, or concerns. Patient verbalized understanding.

## 2022-05-21 NOTE — Telephone Encounter (Signed)
Returned call to patient and made him aware of the below.   Patient states his cholesterol is well controlled on Crestor- last LDL was 45. Patient would like to know if he needs to still see [facility] for lipid management. Will forward message over.

## 2022-05-21 NOTE — Addendum Note (Signed)
Addended by: Bea Laura B on: 05/21/2022 04:01 PM   Modules accepted: Orders

## 2022-05-22 ENCOUNTER — Encounter: Payer: Self-pay | Admitting: Internal Medicine

## 2022-05-23 ENCOUNTER — Telehealth: Payer: Self-pay

## 2022-05-23 ENCOUNTER — Ambulatory Visit: Payer: Medicare HMO | Attending: Internal Medicine | Admitting: Student

## 2022-05-23 ENCOUNTER — Encounter: Payer: Self-pay | Admitting: Student

## 2022-05-23 ENCOUNTER — Other Ambulatory Visit (HOSPITAL_COMMUNITY): Payer: Self-pay

## 2022-05-23 DIAGNOSIS — E782 Mixed hyperlipidemia: Secondary | ICD-10-CM | POA: Diagnosis not present

## 2022-05-23 MED ORDER — REPATHA SURECLICK 140 MG/ML ~~LOC~~ SOAJ: 140.0000 mg | SUBCUTANEOUS | 3 refills | Status: DC

## 2022-05-23 NOTE — Progress Notes (Signed)
Patient ID: Andrew Woodard                 DOB: [birth year]                    MRN: [id number]      HPI: Andrew Woodard is a 72 y.o. male patient referred to lipid clinic by Dr.Acharya. PMH is significant for hypertension, hyperlipidemia, prediabetes, pneumonia, arthritis, ADD, and depression, elevated CAC score of 2253 (Left main 55, LAD 967, LCx 314, RCA 917).  Patient presented with his wife today. He is concern about his CAC score. Since the test he has change his diet, he follows mediterranean diet and walks 15 min every day plus he enjoys golf where he easily gets 3 miles walks three times per week. He is tolerating Crestor 40 mg well without any pain. But he some times gets odd joints pain which comes and go. It is not related to the activities he does but he started having these types of pain episodes after starting Crestor. He drinks 1 bottle of wine per day plus socially. He knows he need to improve on his alcohol consumption. He will cut down gradually.     Current Medications: rosuvastatin 40 mg daily  Intolerances: none  Risk Factors: elevated CAC score of 2253,hypertension, hyperlipidemia, prediabetes, 10 years ASCVD risk score 13.4% Goal: <70 mg/dl  Diet:  Mediterranean diet Exercise: walking 15 min every day and golf - 3 times per week - 3 miles walks   Family History: MGF and PGF- MI in their 17's  Father:kidney problems in his 35's  Social History:  Alcohol: wine 1 bottle per day  Tobacco Smoking: none  Labs: Lipid Panel     Component Value Date/Time   CHOL 126 04/26/2022 0908   TRIG 71 04/26/2022 0908   HDL 67 04/26/2022 0908   CHOLHDL 1.9 04/26/2022 0908   CHOLHDL 3 11/27/2021 0733   VLDL 20.0 11/27/2021 0733   LDLCALC 45 04/26/2022 0908   LDLDIRECT 125.1 01/01/2007 0000   LABVLDL 14 04/26/2022 0908    Past Medical History:  Diagnosis Date   ADD (attention deficit disorder)    Allergy    Arthritis    Colon polyps    Depression    Fasting hyperglycemia     Hyperlipidemia    Hypertension    Pneumonia     Current Outpatient Medications on File Prior to Visit  Medication Sig Dispense Refill   varenicline (CHANTIX) 1 MG tablet Take 1 tablet by mouth twice daily 60 tablet 0   aspirin 81 MG chewable tablet Chew by mouth daily.     lisinopril (ZESTRIL) 20 MG tablet Take 1 tablet (20 mg total) by mouth daily. 90 tablet 3   meloxicam (MOBIC) 15 MG tablet Take 1 tablet (15 mg total) by mouth daily. 90 tablet 3   metoprolol succinate (TOPROL-XL) 50 MG 24 hr tablet Take 1 tablet (50 mg total) by mouth daily. Take with or immediately following a meal. 90 tablet 3   rosuvastatin (CRESTOR) 40 MG tablet Take 1 tablet (40 mg total) by mouth daily. 90 tablet 3   sertraline (ZOLOFT) 100 MG tablet Take 1 tablet (100 mg total) by mouth daily. 90 tablet 3   tadalafil (CIALIS) 20 MG tablet Take 0.5-1 tablets (10-20 mg total) by mouth every other day as needed for erectile dysfunction. 10 tablet 11   No current facility-administered medications on file prior to visit.    Allergies  Allergen Reactions  Penicillins     Throat swelled   Wellbutrin [Bupropion]     Felt bad on it    Assessment/Plan:  1. Hyperlipidemia -  Problem  HYPERLIPIDEMIA   NMR Lipoprofile 2009 : LDL 99 (1963  / 1580  ),  HDL 47, Triglycerides 256 .  LDL goal = < 100 ; ideally <70. MI: MGF & PGF , both 76. No CVA FH Current Medications: rosuvastatin 40 mg daily  Intolerances: none  Risk Factors: elevated CAC score of 2253,hypertension, hyperlipidemia, prediabetes, 10 years ASCVD risk score 13.4% Goal: <70 mg/dl    HYPERLIPIDEMIA Assessment:  LDL goal: < 70 mg/dl last LDLc 45 mg/dl (  /16) Takes Crestor 40 mg daily gets occasional joint pain but it is manageable  Discussed elevated CAC score and given strong family hx of CVD will do Lp(a) test Given risk factors  reasonable to add PCSK9i for primary prevention and reduce dose of statins to improve tolerability   Plan: Continue taking current medications- Crestor 40 mg  Will apply for PA for PCSK9i; will inform patient upon approval (prefers MyChart message) May reduce Crestor dose to 20 or 10 mg daily when start PCSK9i  Lipid lab due in 2-3 months after starting PCSK9i    Thank you,  Carmela Hurt, Pharm.D [facility] A Division of [facility] [address] [place], [place] [address]  Phone: [phone]; Fax: [phone]

## 2022-05-23 NOTE — Assessment & Plan Note (Addendum)
Assessment:  LDL goal: < 70 mg/dl last LDLc 45 mg/dl (  /16) Takes Crestor 40 mg daily gets occasional joint pain but it is manageable  Discussed elevated CAC score and given strong family hx of CVD will do Lp(a) test Given risk factors  reasonable to add PCSK9i for primary prevention and reduce dose of statins to improve tolerability  Plan: Continue taking current medications- Crestor 40 mg  Will apply for PA for PCSK9i; will inform patient upon approval (prefers MyChart message) May reduce Crestor dose to 20 or 10 mg daily when start PCSK9i  Lipid lab due in 2-3 months after starting [facility]

## 2022-05-23 NOTE — Telephone Encounter (Signed)
Pharmacy Patient Advocate Encounter  Prior Authorization for REPATHA has been approved.    Effective dates: 01/21/22 through 01/21/23  Cleven Jansma, CPhT Pharmacy Patient Advocate Specialist Direct Number: [phone] Fax: [phone] 

## 2022-05-23 NOTE — Patient Instructions (Signed)
Your Results:             Your most recent labs Goal  Total Cholesterol 126 < 200  Triglycerides 71 < 150  HDL (happy/good cholesterol) 67 > 40  LDL (lousy/bad cholesterol 45 < 70   Medication changes: We will start the process to get Repatha covered by your insurance.  Once the prior authorization is complete, we will call you to let you know and confirm pharmacy information.        Repatha is a cholesterol medication that improved your body's ability to get rid of "bad cholesterol" known as LDL. It can lower your LDL up to 60%! It is an injection that is given under the skin every 2 weeks. The most common side effects of Repatha include runny nose, symptoms of the common cold, rarely flu or flu-like symptoms, back/muscle pain in about 3-4% of the patients, and redness, pain, or bruising at the injection site.   Lab orders: We want to repeat labs after 2-3 months.  We will send you a lab order to remind you once we get closer to that time.

## 2022-05-23 NOTE — Telephone Encounter (Signed)
Pharmacy Patient Advocate Encounter   Received notification from [facility] [employer] MEDICARE that prior authorization for REPATHA is needed.    PA submitted on 05/23/22 Key [id number] Status is pending  Haze Rushing, CPhT Pharmacy Patient Advocate Specialist Direct Number: [phone] Fax: [phone]

## 2022-05-27 NOTE — Addendum Note (Signed)
Addended by: Tylene Fantasia on: 05/27/2022 11:26 AM   Modules accepted: Orders

## 2022-05-28 ENCOUNTER — Encounter: Payer: Self-pay | Admitting: Internal Medicine

## 2022-05-28 LAB — LIPOPROTEIN A (LPA): Lipoprotein (a): 187.5 nmol/L — ABNORMAL HIGH (ref <75.0)

## 2022-05-29 ENCOUNTER — Encounter: Payer: Self-pay | Admitting: Internal Medicine

## 2022-05-29 NOTE — Telephone Encounter (Signed)
Error

## 2022-05-29 NOTE — Telephone Encounter (Signed)
Patient is calling in to follow up on his lab results. He states a Wellsite geologist or callback can be given for them. Please advise.

## 2022-05-30 ENCOUNTER — Encounter: Payer: Self-pay | Admitting: Internal Medicine

## 2022-06-10 DIAGNOSIS — H5203 Hypermetropia, bilateral: Secondary | ICD-10-CM | POA: Diagnosis not present

## 2022-06-10 DIAGNOSIS — H2513 Age-related nuclear cataract, bilateral: Secondary | ICD-10-CM | POA: Diagnosis not present

## 2022-06-10 DIAGNOSIS — H524 Presbyopia: Secondary | ICD-10-CM | POA: Diagnosis not present

## 2022-06-11 ENCOUNTER — Encounter: Payer: Self-pay | Admitting: Internal Medicine

## 2022-06-11 NOTE — Progress Notes (Signed)
Subjective:    Patient ID: Andrew Woodard, male    DOB: 26-Aug-1950, 72 y.o.   MRN: [id number]     HPI Andrew Woodard is here for follow up of his chronic medical problems.  Working on quitting smoking-on Chantix.  Was 2 ppd - down to 2 cig per day was doing great.  Increased stress - went back to smoking more but not as much as he originally wants.  He really wants to quit smoking.  The Chantix worked well and he tolerated it well and he would like to try that again.  He felt like the starter pack worked better than the continuation pack.   Starterj pack worked well -refilled caused dizzziness  Medications and allergies reviewed with patient and updated if appropriate.  Current Outpatient Medications on File Prior to Visit  Medication Sig Dispense Refill   aspirin 81 MG chewable tablet Chew by mouth daily.     Evolocumab (REPATHA SURECLICK) 140 MG/ML SOAJ Inject 140 mg into the skin every 14 (fourteen) days. 6 mL 3   lisinopril (ZESTRIL) 20 MG tablet Take 1 tablet (20 mg total) by mouth daily. 90 tablet 3   meloxicam (MOBIC) 15 MG tablet Take 1 tablet (15 mg total) by mouth daily. 90 tablet 3   metoprolol succinate (TOPROL-XL) 50 MG 24 hr tablet Take 1 tablet (50 mg total) by mouth daily. Take with or immediately following a meal. 90 tablet 3   rosuvastatin (CRESTOR) 10 MG tablet Take 10 mg by mouth daily.     sertraline (ZOLOFT) 100 MG tablet Take 1 tablet (100 mg total) by mouth daily. 90 tablet 3   tadalafil (CIALIS) 20 MG tablet Take 0.5-1 tablets (10-20 mg total) by mouth every other day as needed for erectile dysfunction. 10 tablet 11   No current facility-administered medications on file prior to visit.     Review of Systems     Objective:   Vitals:   06/12/22 0956  BP: 110/80  Pulse: (!) 58  Temp: 98 F (36.7 C)  SpO2: 96%   BP Readings from Last 3 Encounters:  06/12/22 110/80  03/22/22 116/75  02/07/22 102/67   Wt Readings from Last 3 Encounters:  06/12/22  241 lb (109.3 kg)  03/22/22 239 lb 12.8 oz (108.8 kg)  01/31/22 236 lb 12.8 oz (107.4 kg)   Body mass index is 34.58 kg/m.    Physical Exam Constitutional:      General: He is not in acute distress.    Appearance: Normal appearance. He is not ill-appearing.  HENT:     Head: Normocephalic and atraumatic.  Skin:    General: Skin is warm and dry.  Neurological:     Mental Status: He is alert. Mental status is at baseline.  Psychiatric:        Mood and Affect: Mood normal.        Behavior: Behavior normal.        Thought Content: Thought content normal.        Judgment: Judgment normal.        Lab Results  Component Value Date   WBC 8.2 11/27/2021   HGB 15.2 11/27/2021   HCT 44.2 11/27/2021   PLT 236.0 11/27/2021   GLUCOSE 99 01/31/2022   CHOL 126 04/26/2022   TRIG 71 04/26/2022   HDL 67 04/26/2022   LDLDIRECT 125.1 01/01/2007   LDLCALC 45 04/26/2022   ALT 20 04/26/2022   AST 20 04/26/2022   NA 137  01/31/2022   K 4.5 01/31/2022   CL 100 01/31/2022   CREATININE 1.17 01/31/2022   BUN 33 (H) 01/31/2022   CO2 23 01/31/2022   TSH 1.13 11/27/2021   PSA 1.34 11/27/2021   HGBA1C 6.0 11/27/2021     Assessment & Plan:    See Problem List for Assessment and Plan of chronic medical problems.

## 2022-06-11 NOTE — Patient Instructions (Addendum)
      Blood work was ordered.   The lab is on the first floor.    Medications changes include :       A referral was ordered and someone will call you to schedule an appointment.     Return in about 6 months (around 12/13/2022) for Physical Exam.

## 2022-06-12 ENCOUNTER — Ambulatory Visit (INDEPENDENT_AMBULATORY_CARE_PROVIDER_SITE_OTHER): Payer: Medicare HMO | Admitting: Internal Medicine

## 2022-06-12 VITALS — BP 110/80 | HR 58 | Temp 98.0°F | Ht 70.0 in | Wt 241.0 lb

## 2022-06-12 DIAGNOSIS — R7303 Prediabetes: Secondary | ICD-10-CM | POA: Diagnosis not present

## 2022-06-12 DIAGNOSIS — F3289 Other specified depressive episodes: Secondary | ICD-10-CM

## 2022-06-12 DIAGNOSIS — E782 Mixed hyperlipidemia: Secondary | ICD-10-CM | POA: Diagnosis not present

## 2022-06-12 DIAGNOSIS — F17210 Nicotine dependence, cigarettes, uncomplicated: Secondary | ICD-10-CM | POA: Diagnosis not present

## 2022-06-12 DIAGNOSIS — R931 Abnormal findings on diagnostic imaging of heart and coronary circulation: Secondary | ICD-10-CM

## 2022-06-12 DIAGNOSIS — I10 Essential (primary) hypertension: Secondary | ICD-10-CM | POA: Diagnosis not present

## 2022-06-12 MED ORDER — VARENICLINE TARTRATE (STARTER) 0.5 MG X 11 & 1 MG X 42 PO TBPK: ORAL_TABLET | ORAL | 0 refills | Status: DC

## 2022-06-12 NOTE — Assessment & Plan Note (Signed)
Chronic Has had some increased stress, but feels like his medication is working well and no adjustment is necessary Continue sertraline 100 mg daily

## 2022-06-12 NOTE — Assessment & Plan Note (Signed)
Chronic Blood pressure well controlled Monitor blood pressure at home Continue lisinopril 20 mg daily, metoprolol XL 50 mg daily

## 2022-06-12 NOTE — Assessment & Plan Note (Signed)
Smoking cessation was discussed for more than 4.5 minutes.  The patient is aware of the dangers of tobacco use, and is eager to reduce how much she is smoking and quit completely .  He was recently on the Chantix and tolerated it well and it worked well.  He was able to decrease his smoking from 2 packs a day to 2 cigarettes a day.  Because of recent stress he did increase how much he is smoking again, but he wants to retry the Chantix.  He is on medication for depression, stress and does not feel like it needs to be addressed at this time.  Starter pack of Chantix sent to pharmacy-he will start that sometime in the next week.  He will let me know if he needs anything additional for some of the stress he is going through.

## 2022-06-12 NOTE — Assessment & Plan Note (Signed)
Chronic Currently on Crestor 10 mg daily and was started on Repatha 140 mg q. 14 days To have repeat lipids done in a couple of months by cardiology He has made tremendous changes in his diet and is exercising regularly.

## 2022-06-12 NOTE — Assessment & Plan Note (Signed)
Chronic Has improved his diet and is exercising Will recheck A1c in the fall

## 2022-07-12 ENCOUNTER — Encounter: Payer: Self-pay | Admitting: Internal Medicine

## 2022-07-12 MED ORDER — ROSUVASTATIN CALCIUM 10 MG PO TABS: 10.0000 mg | ORAL_TABLET | Freq: Every day | ORAL | 3 refills | Status: DC

## 2022-08-15 ENCOUNTER — Other Ambulatory Visit: Payer: Self-pay

## 2022-08-15 DIAGNOSIS — E782 Mixed hyperlipidemia: Secondary | ICD-10-CM | POA: Diagnosis not present

## 2022-08-16 ENCOUNTER — Encounter: Payer: Self-pay | Admitting: Internal Medicine

## 2022-08-23 ENCOUNTER — Other Ambulatory Visit: Payer: Medicare HMO

## 2022-09-27 ENCOUNTER — Ambulatory Visit: Payer: Medicare HMO | Attending: Internal Medicine | Admitting: Internal Medicine

## 2022-09-27 ENCOUNTER — Encounter: Payer: Self-pay | Admitting: Internal Medicine

## 2022-09-27 VITALS — BP 124/78 | HR 61 | Ht 70.0 in | Wt 242.0 lb

## 2022-09-27 DIAGNOSIS — R55 Syncope and collapse: Secondary | ICD-10-CM

## 2022-09-27 DIAGNOSIS — E782 Mixed hyperlipidemia: Secondary | ICD-10-CM | POA: Diagnosis not present

## 2022-09-27 DIAGNOSIS — I25119 Atherosclerotic heart disease of native coronary artery with unspecified angina pectoris: Secondary | ICD-10-CM | POA: Diagnosis not present

## 2022-09-27 DIAGNOSIS — I10 Essential (primary) hypertension: Secondary | ICD-10-CM | POA: Diagnosis not present

## 2022-09-27 DIAGNOSIS — R931 Abnormal findings on diagnostic imaging of heart and coronary circulation: Secondary | ICD-10-CM | POA: Diagnosis not present

## 2022-09-27 NOTE — Patient Instructions (Addendum)
Medication Instructions:  The current medical regimen is effective;  continue present plan and medications as directed. Please refer to the Current Medication list given to you today.  *If you need a refill on your cardiac medications before your next appointment, please call your pharmacy*  Lab Work: NONE If you have labs (blood work) drawn today and your tests are completely normal, you will receive your results only by:  MyChart Message (if you have MyChart) OR  A paper copy in the mail If you have any lab test that is abnormal or we need to change your treatment, we will call you to review the results.  Follow-Up: At [facility], you and your health needs are our priority.  As part of our continuing mission to provide you with exceptional heart care, we have created designated Provider Care Teams.  These Care Teams include your primary Cardiologist (physician) and Advanced Practice Providers (APPs -  Physician Assistants and Nurse Practitioners) who all work together to provide you with the care you need, when you need it.  Your next appointment:   6 month(s)  Provider:   Parke Poisson, MD

## 2022-09-27 NOTE — Progress Notes (Unsigned)
Cardiology Office Note:    Date:  09/27/2022   ID:  Griselda Miner, DOB 05/11/1950, MRN [id number]  PCP:  Pincus Sanes, MD  Cardiologist:  None  Electrophysiologist:  None   Referring MD: Pincus Sanes, MD   Chief Complaint/Reason for Referral: Elevated coronary Ca score  History of Present Illness:    Andrew Woodard is a 72 y.o. male with a history of hypertension, hyperlipidemia, prediabetes, pneumonia, arthritis, ADD, and depression, here for the evaluation of elevated coronary calcium score.  Returns for review of testing. Wife present for visit. Provides collaborative history. CCTA with moderate nonobstructive CAD by FFR (personally interpreted). Echo with no source of syncope.   I reviewed heart healthy lifestyle and diet recommendations extensively.  The patient denies chest pain, chest pressure, dyspnea at rest or with exertion, palpitations, PND, orthopnea, or leg swelling. Denies cough, fever, chills. Denies nausea, vomiting. Denies interval syncope or presyncope. Denies dizziness or lightheadedness.   Prior visits: He saw his PCP Dr. Lawerance Bach on 11/29/2021 for his annual physical. He was noted to have a syncopal episode 4 months prior. After waking up he got out of bed and was reaching up when he became lightheaded and passed out. His blood pressure was generally well controlled but it was thought he may be having some orthostatic hypotension at times. HCTZ was discontinued; remained on lisinopril and metoprolol. Has had chronic diarrhea for 10 years. Taking ibgard without much improvement, referred to GI. His regimen includes sertraline for depression, and meloxicam for osteoarthritis. He is a smoker but wanted to quit; he was given Chantix. This is working very well.  Due to high risk for cardiovascular disease he was referred for CT calcium score. On 01/15/22 his CT revealed a coronary calcium score of 2253 (Left main 55, LAD 967, LCx 314, RCA 917), borderline aortic dilatation  at 38 mm (non-contrast) likely upper normal for age and BSA, and aortic atherosclerosis. His PCP started him on 20 mg rosuvastatin and 81 mg aspirin.  Today, he states that he generally feels good. We reviewed the results of his CT scan in detail.  A few times he has felt dizzy, mostly related to positional changes. He confirms one syncopal episode resulting in a fall this past Summer. At the time he had jumped up quickly from a seated position and reached up with his arm. He then passed out. No palpitations or irregular heart beats.  He has been a smoker for over 50 years. However, he has been on Chantix for 3 months which he is tolerating well. He is down to 2-3 cigarettes a day from 40 cigarettes a day at his peak. His coughing and nasal congestion have significantly improved.   Regarding his diet, he frequently eats meat, now eats broccoli salad with chicken at lunch every day since CT, almonds all the time 90 oz water daily.  He enjoys playing golf, usually riding in the cart. Walks his dog a couple miles a day. May walk up to 9 miles a day, blow leaves, mows yard, makes compost.   Under family stressors without significant chest pain but stress does impact overall lifestyle goals.  In his family, both of his grandfathers had heart attacks and lived to their 59's. His mother had an ablation. He denies any personal history of atrial fibrillation.  The patient denies chest pain, chest pressure, dyspnea at rest or with exertion, palpitations, PND, orthopnea, or leg swelling. Denies fever, chills. Denies nausea, vomiting.   Past Medical  History:  Diagnosis Date   ADD (attention deficit disorder)    Allergy    Arthritis    Colon polyps    Depression    Fasting hyperglycemia    Hyperlipidemia    Hypertension    Pneumonia     Past Surgical History:  Procedure Laterality Date   ABSCESS DRAINAGE  2012   L thigh, Dr Johna Sheriff (not MRSA)   COLONOSCOPY  2014   colonoscopy with  polypectomy  2007   Dr Marina Goodell   CYSTECTOMY  2011   L knee, Dr Madelon Lips   [facility]  2013    Dr Alvester Morin   ganglionectomy     PILONIDAL CYST EXCISION     X 2   VASECTOMY     X 2; Dr Vernie Ammons    Current Medications: Current Meds  Medication Sig   aspirin 81 MG chewable tablet Chew by mouth daily.   Evolocumab (REPATHA SURECLICK) 140 MG/ML SOAJ Inject 140 mg into the skin every 14 (fourteen) days.   lisinopril (ZESTRIL) 20 MG tablet Take 1 tablet (20 mg total) by mouth daily.   meloxicam (MOBIC) 15 MG tablet Take 1 tablet (15 mg total) by mouth daily.   metoprolol succinate (TOPROL-XL) 50 MG 24 hr tablet Take 1 tablet (50 mg total) by mouth daily. Take with or immediately following a meal.   rosuvastatin (CRESTOR) 10 MG tablet Take 1 tablet (10 mg total) by mouth daily.   sertraline (ZOLOFT) 100 MG tablet Take 1 tablet (100 mg total) by mouth daily.   tadalafil (CIALIS) 20 MG tablet Take 0.5-1 tablets (10-20 mg total) by mouth every other day as needed for erectile dysfunction.   Varenicline Tartrate, Starter, (CHANTIX STARTING MONTH PAK) 0.5 MG X 11 & 1 MG X 42 TBPK UAD     Allergies:   Penicillins and Wellbutrin [bupropion]   Social History   Tobacco Use   Smoking status: Every Day    Current packs/day: 1.00    Types: Cigarettes   Smokeless tobacco: Never   Tobacco comments:    smokedage18- present , up to 2 ppd for 10 years. 08/09/14 6-7 cigarettes per day.  Substance Use Topics   Alcohol use: Yes    Comment: 28-32  glasses/ week of wine   Drug use: No     Family History: The patient's family history includes Alzheimer's disease in his mother; Colon cancer (age of onset: 66) in his maternal grandmother; Depression in his father; Heart attack (age of onset: 13) in his maternal grandfather and paternal grandfather; Kidney disease in his father. There is no history of Stroke, Diabetes, Esophageal cancer, Rectal cancer, or Stomach cancer.  ROS:   Please see the history of present  illness.     All other systems reviewed and are negative.  EKGs/Labs/Other Studies Reviewed:    The following studies were reviewed today:  CT Calcium Scoring  01/15/2022: FINDINGS: Coronary arteries: Normal origins.   Coronary Calcium Score:   Left main: 55   Left anterior descending artery: 967   Left circumflex artery: 314   Right coronary artery: 917   Total: 2253   Percentile: 95th   Pericardium: Normal.   Aorta: Borderline dilated at 38 mm (non-contrast). Aortic atherosclerosis.   Non-cardiac: See separate report from [facility].   IMPRESSION: 1. Coronary calcium score of 2253. This was 95th percentile for age-, race-, and sex-matched controls. 2. Aorta: Borderline dilated at 38 mm (non-contrast). Aortic atherosclerosis.  CT Chest  06/03/2012: IMPRESSION:  1.  Density on chest radiography likely reflects incidental  localized pleural/diaphragmatic thickening anteriorly along the  costophrenic sulcus.  Similar appearance on contralateral side  argues against this being significant.  2.  3 mm right lower lobe pulmonary nodule. If the patient is at  high risk for bronchogenic carcinoma, follow-up chest CT at 1 year  is recommended.  If the patient is at low risk, no follow-up is  needed.  This recommendation follows the consensus statement:  Guidelines for Management of Small Pulmonary Nodules Detected on CT  Scans:  A Statement from the Fleischner Society as published in  Radiology 2005; 237:395-400.  3. Benign appearing subpleural lymph node along the right major  fissure.  4. Atherosclerosis and paraseptal emphysema.   EKG:  EKG is personally reviewed. 01/31/2022: Sinus bradycardia. Early repolarization in the inferior leads. Rate 58 bpm.  Imaging studies that I have independently reviewed today: CT cor cal reviewed with patient in the room. CCTA reviewed and noted in HPI  Recent Labs: 11/27/2021: Hemoglobin 15.2; Platelets 236.0; TSH  1.13 01/31/2022: BUN 33; Creatinine, Ser 1.17; Potassium 4.5; Sodium 137 04/26/2022: ALT 20   Recent Lipid Panel    Component Value Date/Time   CHOL 105 08/15/2022 0845   TRIG 91 08/15/2022 0845   HDL 57 08/15/2022 0845   CHOLHDL 1.8 08/15/2022 0845   CHOLHDL 3 11/27/2021 0733   VLDL 20.0 11/27/2021 0733   LDLCALC 30 08/15/2022 0845   LDLDIRECT 125.1 01/01/2007 0000    Physical Exam:    VS:  BP 124/78   Pulse 61   Ht 5\' 10"  (1.778 m)   Wt 242 lb (109.8 kg)   SpO2 95%   BMI 34.72 kg/m     Wt Readings from Last 5 Encounters:  09/27/22 242 lb (109.8 kg)  06/12/22 241 lb (109.3 kg)  03/22/22 239 lb 12.8 oz (108.8 kg)  01/31/22 236 lb 12.8 oz (107.4 kg)  01/23/22 232 lb (105.2 kg)    Constitutional: No acute distress Eyes: sclera non-icteric, normal conjunctiva and lids ENMT: normal dentition, moist mucous membranes Cardiovascular: regular rhythm, bradycardic, no murmur. S1 and S2 normal. No jugular venous distention.  Respiratory: clear to auscultation bilaterally GI : normal bowel sounds, soft and nontender. No distention.   MSK: extremities warm, well perfused. No edema.  NEURO: grossly nonfocal exam, moves all extremities. PSYCH: alert and oriented x 3, normal mood and affect.   ASSESSMENT:    1. Elevated coronary artery calcium score - 2253, 12/2021     PLAN:    Elevated coronary artery calcium score - 2253, 12/2021 - Plan: EKG 12-Lead  - noted to have very high calcium score 2253 with family history and in setting of long history of smoking. CCTA with moderate nonobstructive CAD. Cont ASA 81 mg daily. - continue rosuvastatin 40 mg daily, goal LDL <70. Labs in 6 weeks. - BP well controlled today on lisinopril 40 mg daily, metoprolol succ 50 mg daily.   Total time of encounter: 40 minutes total time of encounter, including 30 minutes spent in face-to-face patient care on the date of this encounter. This time includes coordination of care and counseling regarding  above mentioned problem list. Remainder of non-face-to-face time involved reviewing chart documents/testing relevant to the patient encounter and documentation in the medical record. I have independently reviewed documentation from referring provider.   Weston Brass, MD, [facility]  [facility]    Shared Decision Making/Informed Consent:       Medication Adjustments/Labs and  Tests Ordered: Current medicines are reviewed at length with the patient today.  Concerns regarding medicines are outlined above.   Orders Placed This Encounter  Procedures   EKG 12-Lead   No orders of the defined types were placed in this encounter.  There are no Patient Instructions on file for this visit.   ADDENDUM: 09/27/22  Findings on plaque report as noted below:  Labs: LDL 45, HDL 67, trig 71  Total plaque volume on coronary CTA 1449 mm3, this is categorized as extensive plaque volume, with 67% of plaque documented as soft plaque and 484% calcified plaque. 2% of soft plaque is low attenuation. Recommendation is to schedule visit with [facility] for consideration of PCSK9I therapy. -------------------------------------------- Patient had plaque data provided as part of DECIDE registry from coronary CTA, management decisions as follows:  Did you add a medication? No  If yes, how many? 0  If no, reason? Reason for not adding med: Other on high intensity statin  Did you remove a medication? No  Did you increase the dosage of any medication? No  Did you decrease the dosage of any medication? No  Did you refer to a specialist (i.e. lipid clinic, preventive cardiology, endocrinology)? Yes, [facility] lipid clinic  Has patient seen plaque report? No

## 2022-11-10 ENCOUNTER — Other Ambulatory Visit: Payer: Self-pay | Admitting: Internal Medicine

## 2022-11-14 ENCOUNTER — Other Ambulatory Visit: Payer: Self-pay | Admitting: Internal Medicine

## 2022-11-29 ENCOUNTER — Ambulatory Visit (INDEPENDENT_AMBULATORY_CARE_PROVIDER_SITE_OTHER): Payer: Medicare HMO

## 2022-11-29 VITALS — Ht 70.0 in | Wt 242.0 lb

## 2022-11-29 DIAGNOSIS — Z0000 Encounter for general adult medical examination without abnormal findings: Secondary | ICD-10-CM | POA: Diagnosis not present

## 2022-11-29 DIAGNOSIS — Z1211 Encounter for screening for malignant neoplasm of colon: Secondary | ICD-10-CM

## 2022-11-29 NOTE — Patient Instructions (Signed)
Mr. Andrew Woodard , Thank you for taking time to come for your Medicare Wellness Visit. I appreciate your ongoing commitment to your health goals. Please review the following plan we discussed and let me know if I can assist you in the future.   Referrals/Orders/Follow-Ups/Clinician Recommendations: Keep up the good work.  It was nice talking with you today.  This is a list of the screening recommended for you and due dates:  Health Maintenance  Topic Date Due   Colon Cancer Screening  06/05/2022   COVID-19 Vaccine (3 - Pfizer risk series) 12/15/2022*   DTaP/Tdap/Td vaccine (2 - Tdap) 01/21/2023*   Flu Shot  04/21/2023*   Medicare Annual Wellness Visit  11/29/2023   Pneumonia Vaccine  Completed   Hepatitis C Screening  Completed   Zoster (Shingles) Vaccine  Completed   HPV Vaccine  Aged Out  *Topic was postponed. The date shown is not the original due date.    Advanced directives: (Copy Requested) Please bring a copy of your health care power of attorney and living will to the office to be added to your chart at your convenience.  Next Medicare Annual Wellness Visit scheduled for next year: Yes

## 2022-11-29 NOTE — Progress Notes (Signed)
Subjective:   Andrew Woodard is a 72 y.o. male who presents for Medicare Annual/Subsequent preventive examination.  Visit Complete: Virtual I connected with  Andrew Woodard on 11/29/22 by a audio enabled telemedicine application and verified that I am speaking with the correct person using two identifiers.  Patient Location: Home  Provider Location: Home Office  I discussed the limitations of evaluation and management by telemedicine. The patient expressed understanding and agreed to proceed.  Vital Signs: Because this visit was a virtual/telehealth visit, some criteria may be missing or patient reported. Any vitals not documented were not able to be obtained and vitals that have been documented are patient reported.  Patient Medicare AWV questionnaire was completed by the patient on 11/25/2022; I have confirmed that all information answered by patient is correct and no changes since this date.  Cardiac Risk Factors include: advanced age (>74men, >8 women);male gender;dyslipidemia;hypertension     Objective:    Today's Vitals   11/29/22 0942  Weight: 242 lb (109.8 kg)  Height: 5\' 10"  (1.778 m)   Body mass index is 34.72 kg/m.     11/29/2022    9:55 AM 01/05/2021    9:25 AM 11/23/2019   11:39 AM  Advanced Directives  Does Patient Have a Medical Advance Directive? No No No  Would patient like information on creating a medical advance directive?  No - Patient declined No - Patient declined    Current Medications (verified) Outpatient Encounter Medications as of 11/29/2022  Medication Sig   aspirin 81 MG chewable tablet Chew by mouth daily.   Evolocumab (REPATHA SURECLICK) 140 MG/ML SOAJ Inject 140 mg into the skin every 14 (fourteen) days.   lisinopril (ZESTRIL) 20 MG tablet Take 1 tablet (20 mg total) by mouth daily.   meloxicam (MOBIC) 15 MG tablet Take 1 tablet (15 mg total) by mouth daily.   metoprolol succinate (TOPROL-XL) 50 MG 24 hr tablet TAKE 1 TABLET BY MOUTH ONCE  DAILY WITH  OR  IMMEDIATELY  FOLLOWING  A  MEAL.   rosuvastatin (CRESTOR) 10 MG tablet Take 1 tablet (10 mg total) by mouth daily.   sertraline (ZOLOFT) 100 MG tablet Take 1 tablet by mouth once daily   tadalafil (CIALIS) 20 MG tablet Take 0.5-1 tablets (10-20 mg total) by mouth every other day as needed for erectile dysfunction.   Varenicline Tartrate, Starter, (CHANTIX STARTING MONTH PAK) 0.5 MG X 11 & 1 MG X 42 TBPK UAD   No facility-administered encounter medications on file as of 11/29/2022.    Allergies (verified) Penicillins and Wellbutrin [bupropion]   History: Past Medical History:  Diagnosis Date   ADD (attention deficit disorder)    Allergy    Arthritis    Colon polyps    Depression    Fasting hyperglycemia    Hyperlipidemia    Hypertension    Pneumonia    Past Surgical History:  Procedure Laterality Date   ABSCESS DRAINAGE  2012   L thigh, Dr Johna Sheriff (not MRSA)   COLONOSCOPY  2014   colonoscopy with polypectomy  2007   Dr Marina Goodell   CYSTECTOMY  2011   L knee, Dr Madelon Lips   [facility]  2013    Dr Alvester Morin   ganglionectomy     PILONIDAL CYST EXCISION     X 2   VASECTOMY     X 2; Dr Vernie Ammons   Family History  Problem Relation Age of Onset   Alzheimer's disease Mother    Depression Father  Kidney disease Father        ESRD   Colon cancer Maternal Grandmother 92   Heart attack Paternal Grandfather 82   Heart attack Maternal Grandfather 76        pacer   Stroke Neg Hx    Diabetes Neg Hx    Esophageal cancer Neg Hx    Rectal cancer Neg Hx    Stomach cancer Neg Hx    Social History   Socioeconomic History   Marital status: Married    Spouse name: Andrew Woodard   Number of children: 2   Years of education: Not on file   Highest education level: Associate degree: academic program  Occupational History   Occupation: retired  Tobacco Use   Smoking status: Every Day    Current packs/day: 1.00    Types: Cigarettes   Smokeless tobacco: Never   Tobacco comments:     smokedage18- present , up to 2 ppd for 10 years. 08/09/14 6-7 cigarettes per day.  Per pt-1 pack a day/20  Vaping Use   Vaping status: Not on file  Substance and Sexual Activity   Alcohol use: Yes    Comment: 28-32  glasses/ week of wine   Drug use: No   Sexual activity: Not on file  Other Topics Concern   Not on file  Social History Narrative   Walking regularly for exercise   Plays golf   Lives with wife and his son and twin granddaughter.   Social Determinants of Health   Financial Resource Strain: Low Risk  (11/25/2022)   Overall Financial Resource Strain (CARDIA)    Difficulty of Paying Living Expenses: Not hard at all  Food Insecurity: No Food Insecurity (11/25/2022)   Hunger Vital Sign    Worried About Running Out of Food in the Last Year: Never true    Ran Out of Food in the Last Year: Never true  Transportation Needs: No Transportation Needs (11/25/2022)   PRAPARE - Administrator, Civil Service (Medical): No    Lack of Transportation (Non-Medical): No  Physical Activity: Sufficiently Active (11/25/2022)   Exercise Vital Sign    Days of Exercise per Week: 6 days    Minutes of Exercise per Session: 30 min  Stress: No Stress Concern Present (11/25/2022)   [employer] of Occupational Health - Occupational Stress Questionnaire    Feeling of Stress : Only a little  Social Connections: Moderately Integrated (11/25/2022)   Social Connection and Isolation Panel [NHANES]    Frequency of Communication with Friends and Family: More than three times a week    Frequency of Social Gatherings with Friends and Family: More than three times a week    Attends Religious Services: 1 to 4 times per year    Active Member of [employer] or Organizations: No    Attends Engineer, structural: Patient declined    Marital Status: Married    Tobacco Counseling Ready to quit: Not Answered Counseling given: Not Answered Tobacco comments: smokedage18- present , up to 2 ppd for  10 years. 08/09/14 6-7 cigarettes per day.  Per pt-1 pack a day/20   Clinical Intake:  Pre-visit preparation completed: Yes  Pain : No/denies pain     BMI - recorded: 34.72 Nutritional Status: BMI > 30  Obese Nutritional Risks: Nausea/ vomitting/ diarrhea Diabetes: No  How often do you need to have someone help you when you read instructions, pamphlets, or other written materials from your doctor or pharmacy?: 1 - Never  Interpreter Needed?: No  Information entered by :: Andrew Woodard, Andrew Woodard   Activities of Daily Living    11/25/2022    9:53 AM  In your present state of health, do you have any difficulty performing the following activities:  Hearing? 0  Vision? 0  Difficulty concentrating or making decisions? 0  Walking or climbing stairs? 0  Dressing or bathing? 0  Doing errands, shopping? 0  Preparing Food and eating ? N  Using the Toilet? N  In the past six months, have you accidently leaked urine? N  Do you have problems with loss of bowel control? Y  Managing your Medications? N  Managing your Finances? N  Housekeeping or managing your Housekeeping? N    Patient Care Team: Pincus Sanes, MD as PCP - General (Internal Medicine) Parke Poisson, MD as PCP - Cardiology (Cardiology) Mckinley Jewel, MD as Consulting Physician (Ophthalmology) Antony Contras, MD as Consulting Physician (Ophthalmology)  Indicate any recent Medical Services you may have received from other than [facility] providers in the past year (date may be approximate).     Assessment:   This is a routine wellness examination for Latta.  Hearing/Vision screen Hearing Screening - Comments:: Denies hearing difficulties   Vision Screening - Comments:: Wears eyeglasses for reading   Goals Addressed               This Visit's Progress     Patient Stated (pt-stated)   On track     My goal is to increase my physical activity by walking more.      Depression Screen    11/29/2022     9:58 AM 11/29/2021   11:24 AM 01/05/2021    9:29 AM 11/23/2019   11:37 AM 04/09/2017    9:42 AM  PHQ 2/9 Scores  PHQ - 2 Score 1 0 0 0 0  PHQ- 9 Score 1        Fall Risk    11/25/2022    9:53 AM 11/29/2021   11:20 AM 01/05/2021    9:26 AM 11/28/2020    2:22 PM 11/23/2019   11:40 AM  Fall Risk   Falls in the past year? 0 1 0 0 0  Number falls in past yr: 0 0 0 0 0  Injury with Fall? 0 0 0 0 0  Risk for fall due to :  History of fall(s) No Fall Risks No Fall Risks No Fall Risks  Follow up Falls evaluation completed;Falls prevention discussed Falls evaluation completed Falls prevention discussed Falls evaluation completed Falls evaluation completed    MEDICARE RISK AT HOME: Medicare Risk at Home Any stairs in or around the home?: Yes If so, are there any without handrails?: No Home Woodard of loose throw rugs in walkways, pet beds, electrical cords, etc?: No Adequate lighting in your home to reduce risk of falls?: Yes Life alert?: No Use of a cane, walker or w/c?: No Grab bars in the bathroom?: Yes Shower chair or bench in shower?: No Elevated toilet seat or a handicapped toilet?: Yes  TIMED UP AND GO:  Was the test performed?  No    Cognitive Function:        11/29/2022    9:56 AM  6CIT Screen  What Year? 0 points  What month? 0 points  What time? 0 points  Count back from 20 0 points  Months in reverse 0 points  Repeat phrase 0 points  Total Score 0 points    Immunizations Immunization  History  Administered Date(s) Administered   Fluad Quad(high Dose 65+) 10/14/2018, 12/02/2019, 11/29/2021   Influenza Whole 10/22/2011   Influenza, High Dose Seasonal PF 02/09/2018   Influenza,inj,Quad PF,6+ Mos 01/05/2015   Influenza-Unspecified 10/21/2016, 11/28/2020   PFIZER(Purple Top)SARS-COV-2 Vaccination 02/28/2019, 03/25/2019   Pneumococcal Conjugate-13 10/14/2018   Pneumococcal Polysaccharide-23 11/28/2020   Pneumococcal-Unspecified 10/22/2011   Td 06/07/2008   Zoster  Recombinant(Shingrix) 10/17/2017, 02/09/2018    TDAP status: Due, Education has been provided regarding the importance of this vaccine. Advised may receive this vaccine at local pharmacy or Health Dept. Aware to provide a copy of the vaccination record if obtained from local pharmacy or Health Dept. Verbalized acceptance and understanding.  Flu Vaccine status: Up to date  Pneumococcal vaccine status: Up to date  Covid-19 vaccine status: Declined, Education has been provided regarding the importance of this vaccine but patient still declined. Advised may receive this vaccine at local pharmacy or Health Dept.or vaccine clinic. Aware to provide a copy of the vaccination record if obtained from local pharmacy or Health Dept. Verbalized acceptance and understanding.  Qualifies for Shingles Vaccine? Yes   Zostavax completed Yes   Shingrix Completed?: Yes  Screening Tests Health Maintenance  Topic Date Due   Colonoscopy  06/05/2022   COVID-19 Vaccine (3 - Pfizer risk series) 12/15/2022 (Originally 04/22/2019)   DTaP/Tdap/Td (2 - Tdap) 01/21/2023 (Originally 06/08/2018)   INFLUENZA VACCINE  04/21/2023 (Originally 08/22/2022)   Medicare Annual Wellness (AWV)  11/29/2023   Pneumonia Vaccine 3+ Years old  Completed   Hepatitis C Screening  Completed   Zoster Vaccines- Shingrix  Completed   HPV VACCINES  Aged Out    Health Maintenance  Health Maintenance Due  Topic Date Due   Colonoscopy  06/05/2022    Colorectal cancer screening: Referral to GI placed 11/29/2022. Pt aware the office will call re: appt.  Lung Cancer Screening: (Low Dose CT Chest recommended if Age 65-80 years, 20 pack-year currently smoking OR have quit w/in 15years.) does qualify.   Lung Cancer Screening Referral: N/A  Additional Screening:  Hepatitis C Screening: does qualify; Completed 04/06/2015  Vision Screening: Recommended annual ophthalmology exams for early detection of glaucoma and other disorders of the  eye. Is the patient up to date with their annual eye exam?  Yes  Who is the provider or what is the name of the office in which the patient attends annual eye exams? Dr. Randon Goldsmith If pt is not established with a provider, would they like to be referred to a provider to establish care? No .   Dental Screening: Recommended annual dental exams for proper oral hygiene   Community Resource Referral / Chronic Care Management: CRR required this visit?  No   CCM required this visit?  No     Plan:     I have personally reviewed and noted the following in the patient's chart:   Medical and social history Use of alcohol, tobacco or illicit drugs  Current medications and supplements including opioid prescriptions. Patient is not currently taking opioid prescriptions. Functional ability and status Nutritional status Physical activity Advanced directives List of other physicians Hospitalizations, surgeries, and ER visits in previous 12 months Vitals Screenings to include cognitive, depression, and falls Referrals and appointments  In addition, I have reviewed and discussed with patient certain preventive protocols, quality metrics, and best practice recommendations. A written personalized care plan for preventive services as well as general preventive health recommendations were provided to patient.     Andrew Woodard L  Andrew Woodard, Andrew Woodard   11/29/2022   After Visit Summary: (MyChart) Due to this being a telephonic visit, the after visit summary with patients personalized plan was offered to patient via MyChart   Nurse Notes: Patient is due for a Flu vaccine and a Tdap.  He is also due for a colonoscopy and would like to discuss with Dr. Lawerance Bach about choosing a GI provider.  Patient had no other concerns to address today.

## 2022-12-01 ENCOUNTER — Encounter: Payer: Self-pay | Admitting: Internal Medicine

## 2022-12-01 DIAGNOSIS — I2510 Atherosclerotic heart disease of native coronary artery without angina pectoris: Secondary | ICD-10-CM | POA: Insufficient documentation

## 2022-12-01 NOTE — Patient Instructions (Addendum)
Flu immunization administered today.     Blood work was ordered.   The lab is on the first floor.    Medications changes include :   none      Return in about 6 months (around 06/01/2023) for follow up.   Health Maintenance, Male Adopting a healthy lifestyle and getting preventive care are important in promoting health and wellness. Ask your health care provider about: The right schedule for you to have regular tests and exams. Things you can do on your own to prevent diseases and keep yourself healthy. What should I know about diet, weight, and exercise? Eat a healthy diet  Eat a diet that includes plenty of vegetables, fruits, low-fat dairy products, and lean protein. Do not eat a lot of foods that are high in solid fats, added sugars, or sodium. Maintain a healthy weight Body mass index (BMI) is a measurement that can be used to identify possible weight problems. It estimates body fat based on height and weight. Your health care provider can help determine your BMI and help you achieve or maintain a healthy weight. Get regular exercise Get regular exercise. This is one of the most important things you can do for your health. Most adults should: Exercise for at least 150 minutes each week. The exercise should increase your heart rate and make you sweat (moderate-intensity exercise). Do strengthening exercises at least twice a week. This is in addition to the moderate-intensity exercise. Spend less time sitting. Even light physical activity can be beneficial. Watch cholesterol and blood lipids Have your blood tested for lipids and cholesterol at 70 years of age, then have this test every 5 years. You may need to have your cholesterol levels checked more often if: Your lipid or cholesterol levels are high. You are older than 70 years of age. You are at high risk for heart disease. What should I know about cancer screening? Many types of cancers can be detected early and  may often be prevented. Depending on your health history and family history, you may need to have cancer screening at various ages. This may include screening for: Colorectal cancer. Prostate cancer. Skin cancer. Lung cancer. What should I know about heart disease, diabetes, and high blood pressure? Blood pressure and heart disease High blood pressure causes heart disease and increases the risk of stroke. This is more likely to develop in people who have high blood pressure readings or are overweight. Talk with your health care provider about your target blood pressure readings. Have your blood pressure checked: Every 3-5 years if you are 45-54 years of age. Every year if you are 56 years old or older. If you are between the ages of 33 and 17 and are a current or former smoker, ask your health care provider if you should have a one-time screening for abdominal aortic aneurysm (AAA). Diabetes Have regular diabetes screenings. This checks your fasting blood sugar level. Have the screening done: Once every three years after age 6 if you are at a normal weight and have a low risk for diabetes. More often and at a younger age if you are overweight or have a high risk for diabetes. What should I know about preventing infection? Hepatitis B If you have a higher risk for hepatitis B, you should be screened for this virus. Talk with your health care provider to find out if you are at risk for hepatitis B infection. Hepatitis C Blood testing is recommended for: Everyone born  from 1945 through 1965. Anyone with known risk factors for hepatitis C. Sexually transmitted infections (STIs) You should be screened each year for STIs, including gonorrhea and chlamydia, if: You are sexually active and are younger than 70 years of age. You are older than 70 years of age and your health care provider tells you that you are at risk for this type of infection. Your sexual activity has changed since you were  last screened, and you are at increased risk for chlamydia or gonorrhea. Ask your health care provider if you are at risk. Ask your health care provider about whether you are at high risk for HIV. Your health care provider may recommend a prescription medicine to help prevent HIV infection. If you choose to take medicine to prevent HIV, you should first get tested for HIV. You should then be tested every 3 months for as long as you are taking the medicine. Follow these instructions at home: Alcohol use Do not drink alcohol if your health care provider tells you not to drink. If you drink alcohol: Limit how much you have to 0-2 drinks a day. Know how much alcohol is in your drink. In the [place] one drink equals one 12 oz bottle of beer (355 mL), one 5 oz glass of wine (148 mL), or one 1 oz glass of hard liquor (44 mL). Lifestyle Do not use any products that contain nicotine or tobacco. These products include cigarettes, chewing tobacco, and vaping devices, such as e-cigarettes. If you need help quitting, ask your health care provider. Do not use street drugs. Do not share needles. Ask your health care provider for help if you need support or information about quitting drugs. General instructions Schedule regular health, dental, and eye exams. Stay current with your vaccines. Tell your health care provider if: You often feel depressed. You have ever been abused or do not feel safe at home. Summary Adopting a healthy lifestyle and getting preventive care are important in promoting health and wellness. Follow your health care provider's instructions about healthy diet, exercising, and getting tested or screened for diseases. Follow your health care provider's instructions on monitoring your cholesterol and blood pressure. This information is not intended to replace advice given to you by your health care provider. Make sure you discuss any questions you have with your health care  provider. Document Revised: 05/29/2020 Document Reviewed: 05/29/2020 Elsevier Patient Education  2024 [employer].

## 2022-12-01 NOTE — Progress Notes (Unsigned)
Subjective:    Patient ID: Andrew Woodard, male    DOB: 31-Oct-1950, 72 y.o.   MRN: [id number]     HPI Ridley is here for a physical exam and his chronic medical problems.      Medications and allergies reviewed with patient and updated if appropriate.  Current Outpatient Medications on File Prior to Visit  Medication Sig Dispense Refill   aspirin 81 MG chewable tablet Chew by mouth daily.     Evolocumab (REPATHA SURECLICK) 140 MG/ML SOAJ Inject 140 mg into the skin every 14 (fourteen) days. 6 mL 3   lisinopril (ZESTRIL) 20 MG tablet Take 1 tablet (20 mg total) by mouth daily. 90 tablet 3   meloxicam (MOBIC) 15 MG tablet Take 1 tablet (15 mg total) by mouth daily. 90 tablet 3   metoprolol succinate (TOPROL-XL) 50 MG 24 hr tablet TAKE 1 TABLET BY MOUTH ONCE DAILY WITH  OR  IMMEDIATELY  FOLLOWING  A  MEAL. 90 tablet 0   rosuvastatin (CRESTOR) 10 MG tablet Take 1 tablet (10 mg total) by mouth daily. 90 tablet 3   sertraline (ZOLOFT) 100 MG tablet Take 1 tablet by mouth once daily 90 tablet 0   tadalafil (CIALIS) 20 MG tablet Take 0.5-1 tablets (10-20 mg total) by mouth every other day as needed for erectile dysfunction. 10 tablet 11   Varenicline Tartrate, Starter, (CHANTIX STARTING MONTH PAK) 0.5 MG X 11 & 1 MG X 42 TBPK UAD 1 each 0   No current facility-administered medications on file prior to visit.    Review of Systems     Objective:  There were no vitals filed for this visit. There were no vitals filed for this visit. There is no height or weight on file to calculate BMI.  BP Readings from Last 3 Encounters:  09/27/22 124/78  06/12/22 110/80  03/22/22 116/75    Wt Readings from Last 3 Encounters:  11/29/22 242 lb (109.8 kg)  09/27/22 242 lb (109.8 kg)  06/12/22 241 lb (109.3 kg)      Physical Exam Constitutional: He appears well-developed and well-nourished. No distress.  HENT:  Head: Normocephalic and atraumatic.  Right Ear: External ear normal.  Left  Ear: External ear normal.  Normal ear canals and TM b/l  Mouth/Throat: Oropharynx is clear and moist. Eyes: Conjunctivae and EOM are normal.  Neck: Neck supple. No tracheal deviation present. No thyromegaly present.  No carotid bruit  Cardiovascular: Normal rate, regular rhythm, normal heart sounds and intact distal pulses.   No murmur heard.  No lower extremity edema. Pulmonary/Chest: Effort normal and breath sounds normal. No respiratory distress. He has no wheezes. He has no rales.  Abdominal: Soft. He exhibits no distension. There is no tenderness.  Genitourinary: deferred  Lymphadenopathy:   He has no cervical adenopathy.  Skin: Skin is warm and dry. He is not diaphoretic.  Psychiatric: He has a normal mood and affect. His behavior is normal.         Assessment & Plan:   Physical exam: Screening blood work  ordered Exercise    Weight   Substance abuse   none   Reviewed recommended immunizations.   Health Maintenance  Topic Date Due   Colonoscopy  06/05/2022   COVID-19 Vaccine (3 - Pfizer risk series) 12/15/2022 (Originally 04/22/2019)   DTaP/Tdap/Td (2 - Tdap) 01/21/2023 (Originally 06/08/2018)   INFLUENZA VACCINE  04/21/2023 (Originally 08/22/2022)   Medicare Annual Wellness (AWV)  11/29/2023   Pneumonia Vaccine 65+ Years  old  Completed   Hepatitis C Screening  Completed   Zoster Vaccines- Shingrix  Completed   HPV VACCINES  Aged Out     See Problem List for Assessment and Plan of chronic medical problems.

## 2022-12-02 ENCOUNTER — Ambulatory Visit: Payer: Medicare HMO | Admitting: Internal Medicine

## 2022-12-02 VITALS — BP 124/80 | HR 60 | Temp 98.6°F | Ht 70.0 in | Wt 237.0 lb

## 2022-12-02 DIAGNOSIS — M150 Primary generalized (osteo)arthritis: Secondary | ICD-10-CM | POA: Diagnosis not present

## 2022-12-02 DIAGNOSIS — I10 Essential (primary) hypertension: Secondary | ICD-10-CM | POA: Diagnosis not present

## 2022-12-02 DIAGNOSIS — Z0000 Encounter for general adult medical examination without abnormal findings: Secondary | ICD-10-CM

## 2022-12-02 DIAGNOSIS — N5201 Erectile dysfunction due to arterial insufficiency: Secondary | ICD-10-CM | POA: Diagnosis not present

## 2022-12-02 DIAGNOSIS — E782 Mixed hyperlipidemia: Secondary | ICD-10-CM | POA: Diagnosis not present

## 2022-12-02 DIAGNOSIS — Z23 Encounter for immunization: Secondary | ICD-10-CM | POA: Diagnosis not present

## 2022-12-02 DIAGNOSIS — I2510 Atherosclerotic heart disease of native coronary artery without angina pectoris: Secondary | ICD-10-CM | POA: Diagnosis not present

## 2022-12-02 DIAGNOSIS — Z125 Encounter for screening for malignant neoplasm of prostate: Secondary | ICD-10-CM

## 2022-12-02 DIAGNOSIS — F3289 Other specified depressive episodes: Secondary | ICD-10-CM

## 2022-12-02 DIAGNOSIS — R7303 Prediabetes: Secondary | ICD-10-CM

## 2022-12-02 MED ORDER — MELOXICAM 15 MG PO TABS: 15.0000 mg | ORAL_TABLET | Freq: Every day | ORAL | 3 refills | Status: DC

## 2022-12-02 MED ORDER — TADALAFIL 20 MG PO TABS: 10.0000 mg | ORAL_TABLET | ORAL | 11 refills | Status: DC | PRN

## 2022-12-02 MED ORDER — SERTRALINE HCL 100 MG PO TABS: 100.0000 mg | ORAL_TABLET | Freq: Every day | ORAL | 1 refills | Status: DC

## 2022-12-02 MED ORDER — METOPROLOL SUCCINATE ER 50 MG PO TB24: 50.0000 mg | ORAL_TABLET | Freq: Every day | ORAL | 1 refills | Status: DC

## 2022-12-02 NOTE — Assessment & Plan Note (Signed)
Chronic Blood pressure well controlled CBC, CMP Monitor blood pressure at home Continue lisinopril 20 mg daily, metoprolol XL 50 mg daily

## 2022-12-02 NOTE — Assessment & Plan Note (Signed)
Chronic Has improved his diet and is exercising Check A1c Stressed low sugar/carbohydrate diet and regular exercise

## 2022-12-02 NOTE — Assessment & Plan Note (Signed)
Chronic Continue Cialis 10-20 mg daily as needed If this is not effective can try Viagra

## 2022-12-02 NOTE — Assessment & Plan Note (Signed)
Chronic Taking meloxicam 15 mg daily, which does help Continue

## 2022-12-02 NOTE — Addendum Note (Signed)
Addended by: Karma Ganja on: 12/02/2022 04:07 PM   Modules accepted: Orders

## 2022-12-02 NOTE — Assessment & Plan Note (Signed)
Chronic Seen on CT scan Asymptomatic Following with cardiology On Repatha, rosuvastatin, aspirin 81 mg

## 2022-12-02 NOTE — Assessment & Plan Note (Signed)
Chronic Continue Crestor 10 mg daily, Repatha 140 mg q. 14 days CMP, lipid He has made tremendous changes in his diet and is exercising regularly.

## 2022-12-02 NOTE — Assessment & Plan Note (Addendum)
Chronic Fairly controlled Has increased stress recently Discussed if needed we can increase the sertraline for could change to something different if needed Continue sertraline 100 mg daily

## 2023-02-04 ENCOUNTER — Other Ambulatory Visit: Payer: Self-pay | Admitting: Internal Medicine

## 2023-03-12 DIAGNOSIS — D485 Neoplasm of uncertain behavior of skin: Secondary | ICD-10-CM | POA: Diagnosis not present

## 2023-03-12 DIAGNOSIS — L281 Prurigo nodularis: Secondary | ICD-10-CM | POA: Diagnosis not present

## 2023-03-12 DIAGNOSIS — D235 Other benign neoplasm of skin of trunk: Secondary | ICD-10-CM | POA: Diagnosis not present

## 2023-03-12 DIAGNOSIS — L821 Other seborrheic keratosis: Secondary | ICD-10-CM | POA: Diagnosis not present

## 2023-03-12 DIAGNOSIS — D229 Melanocytic nevi, unspecified: Secondary | ICD-10-CM | POA: Diagnosis not present

## 2023-03-12 DIAGNOSIS — D225 Melanocytic nevi of trunk: Secondary | ICD-10-CM | POA: Diagnosis not present

## 2023-03-12 DIAGNOSIS — L570 Actinic keratosis: Secondary | ICD-10-CM | POA: Diagnosis not present

## 2023-03-12 DIAGNOSIS — L578 Other skin changes due to chronic exposure to nonionizing radiation: Secondary | ICD-10-CM | POA: Diagnosis not present

## 2023-03-12 DIAGNOSIS — L814 Other melanin hyperpigmentation: Secondary | ICD-10-CM | POA: Diagnosis not present

## 2023-03-12 DIAGNOSIS — D1801 Hemangioma of skin and subcutaneous tissue: Secondary | ICD-10-CM | POA: Diagnosis not present

## 2023-04-07 DIAGNOSIS — D239 Other benign neoplasm of skin, unspecified: Secondary | ICD-10-CM | POA: Diagnosis not present

## 2023-04-07 DIAGNOSIS — L570 Actinic keratosis: Secondary | ICD-10-CM | POA: Diagnosis not present

## 2023-04-07 DIAGNOSIS — D225 Melanocytic nevi of trunk: Secondary | ICD-10-CM | POA: Diagnosis not present

## 2023-04-09 ENCOUNTER — Ambulatory Visit: Payer: Medicare HMO | Attending: Internal Medicine | Admitting: Internal Medicine

## 2023-04-09 VITALS — BP 130/80 | Ht 70.0 in | Wt 245.0 lb

## 2023-04-09 DIAGNOSIS — F17210 Nicotine dependence, cigarettes, uncomplicated: Secondary | ICD-10-CM | POA: Diagnosis not present

## 2023-04-09 DIAGNOSIS — R931 Abnormal findings on diagnostic imaging of heart and coronary circulation: Secondary | ICD-10-CM | POA: Diagnosis not present

## 2023-04-09 DIAGNOSIS — I10 Essential (primary) hypertension: Secondary | ICD-10-CM

## 2023-04-09 DIAGNOSIS — R55 Syncope and collapse: Secondary | ICD-10-CM | POA: Diagnosis not present

## 2023-04-09 DIAGNOSIS — I25119 Atherosclerotic heart disease of native coronary artery with unspecified angina pectoris: Secondary | ICD-10-CM

## 2023-04-09 DIAGNOSIS — E782 Mixed hyperlipidemia: Secondary | ICD-10-CM | POA: Diagnosis not present

## 2023-04-09 DIAGNOSIS — F1010 Alcohol abuse, uncomplicated: Secondary | ICD-10-CM

## 2023-04-09 NOTE — Patient Instructions (Addendum)
 Medication Instructions:  No Changes   *If you need a refill on your cardiac medications before your next appointment, please call your pharmacy*  Lab Work: None  Follow-Up: At [facility], you and your health needs are our priority.  As part of our continuing mission to provide you with exceptional heart care, we have created designated Provider Care Teams.  These Care Teams include your primary Cardiologist (physician) and Advanced Practice Providers (APPs -  Physician Assistants and Nurse Practitioners) who all work together to provide you with the care you need, when you need it.   Your next appointment:   1 year(s)  Provider:   Parke Poisson, MD

## 2023-04-09 NOTE — Progress Notes (Signed)
 Cardiology Office Note:    Date:  04/09/2023   ID:  Andrew Woodard, DOB [birth year], MRN [id number]  PCP:  Pincus Sanes, MD  Cardiologist:  Parke Poisson, MD  Electrophysiologist:  None   Referring MD: Pincus Sanes, MD   Chief Complaint/Reason for Referral: Elevated coronary Ca score  History of Present Illness:    Andrew Woodard is a 73 y.o. male with a history of hypertension, hyperlipidemia, prediabetes, pneumonia, arthritis, ADD, and depression, here for the evaluation of elevated coronary calcium score.  04/09/2023 Doing well overall, no significant symptoms. Struggling with lifestyle modification, we discussed in detail. The patient denies chest pain, chest pressure, dyspnea at rest or with exertion, palpitations, PND, orthopnea, or leg swelling. Denies cough, fever, chills. Denies nausea, vomiting. Denies syncope or presyncope. Denies dizziness or lightheadedness.    Prior visits: He is overall trying to follow a heart healthy diet and exercise.  Had some success with Chantix but stopped, looks forward to potentially resuming Chantix, did feel better and coughed less while not smoking.  We reviewed that smoking cessation would be extremely beneficial to ongoing coronary health. CCTA with moderate nonobstructive CAD by FFR (personally interpreted). Echo with no source of syncope.  Labs from July 2024 reviewed with excellent LDL control at 38, triglycerides 90, patient is currently on Repatha and rosuvastatin 10 mg daily.  I reviewed heart healthy lifestyle and diet recommendations extensively.  Overall he is doing extremely well and should continue to pursue lifestyle modification.  The patient denies chest pain, chest pressure, dyspnea at rest or with exertion, palpitations, PND, orthopnea, or leg swelling. Denies cough, fever, chills. Denies nausea, vomiting. Denies interval syncope or presyncope. Denies dizziness or lightheadedness.   He saw his PCP Dr. Lawerance Bach on 11/29/2021  for his annual physical. He was noted to have a syncopal episode 4 months prior. After waking up he got out of bed and was reaching up when he became lightheaded and passed out. His blood pressure was generally well controlled but it was thought he may be having some orthostatic hypotension at times. HCTZ was discontinued; remained on lisinopril and metoprolol. Has had chronic diarrhea for 10 years. Taking ibgard without much improvement, referred to GI. His regimen includes sertraline for depression, and meloxicam for osteoarthritis. He is a smoker but wanted to quit; he was given Chantix. This is working very well.  Due to high risk for cardiovascular disease he was referred for CT calcium score. On 01/15/22 his CT revealed a coronary calcium score of 2253 (Left main 55, LAD 967, LCx 314, RCA 917), borderline aortic dilatation at 38 mm (non-contrast) likely upper normal for age and BSA, and aortic atherosclerosis. His PCP started him on 20 mg rosuvastatin and 81 mg aspirin.  Today, he states that he generally feels good. We reviewed the results of his CT scan in detail.  A few times he has felt dizzy, mostly related to positional changes. He confirms one syncopal episode resulting in a fall this past Summer. At the time he had jumped up quickly from a seated position and reached up with his arm. He then passed out. No palpitations or irregular heart beats.  He has been a smoker for over 50 years. However, he has been on Chantix for 3 months which he is tolerating well. He is down to 2-3 cigarettes a day from 40 cigarettes a day at his peak. His coughing and nasal congestion have significantly improved.   Regarding his diet, he frequently  eats meat, now eats broccoli salad with chicken at lunch every day since CT, almonds all the time 90 oz water daily.  He enjoys playing golf, usually riding in the cart. Walks his dog a couple miles a day. May walk up to 9 miles a day, blow leaves, mows yard, makes  compost.   Under family stressors without significant chest pain but stress does impact overall lifestyle goals.  In his family, both of his grandfathers had heart attacks and lived to their 65's. His mother had an ablation. He denies any personal history of atrial fibrillation.  The patient denies chest pain, chest pressure, dyspnea at rest or with exertion, palpitations, PND, orthopnea, or leg swelling. Denies fever, chills. Denies nausea, vomiting.   Past Medical History:  Diagnosis Date   ADD (attention deficit disorder)    Allergy    Arthritis    Colon polyps    Depression    Fasting hyperglycemia    Hyperlipidemia    Hypertension    Pneumonia     Past Surgical History:  Procedure Laterality Date   ABSCESS DRAINAGE  2012   L thigh, Dr Johna Sheriff (not MRSA)   COLONOSCOPY  2014   colonoscopy with polypectomy  2007   Dr Marina Goodell   CYSTECTOMY  2011   L knee, Dr Madelon Lips   [facility]  2013    Dr Alvester Morin   ganglionectomy     PILONIDAL CYST EXCISION     X 2   VASECTOMY     X 2; Dr Vernie Ammons    Current Medications: Current Meds  Medication Sig   aspirin 81 MG chewable tablet Chew by mouth daily.   Evolocumab (REPATHA SURECLICK) 140 MG/ML SOAJ Inject 140 mg into the skin every 14 (fourteen) days.   lisinopril (ZESTRIL) 20 MG tablet Take 1 tablet by mouth once daily   meloxicam (MOBIC) 15 MG tablet Take 1 tablet (15 mg total) by mouth daily.   metoprolol succinate (TOPROL-XL) 50 MG 24 hr tablet Take 1 tablet (50 mg total) by mouth daily. Take with or immediately following a meal.   rosuvastatin (CRESTOR) 10 MG tablet Take 1 tablet (10 mg total) by mouth daily.   sertraline (ZOLOFT) 100 MG tablet Take 1 tablet (100 mg total) by mouth daily.   tadalafil (CIALIS) 20 MG tablet Take 0.5-1 tablets (10-20 mg total) by mouth every other day as needed for erectile dysfunction.   Varenicline Tartrate, Starter, (CHANTIX STARTING MONTH PAK) 0.5 MG X 11 & 1 MG X 42 TBPK UAD     Allergies:    Penicillins and Wellbutrin [bupropion]   Social History   Tobacco Use   Smoking status: Every Day    Current packs/day: 1.00    Types: Cigarettes   Smokeless tobacco: Never   Tobacco comments:    smokedage18- present , up to 2 ppd for 10 years. 08/09/14 6-7 cigarettes per day.  Per pt-1 pack a day/20  Substance Use Topics   Alcohol use: Yes    Comment: 28-32  glasses/ week of wine   Drug use: No     Family History: The patient's family history includes Alzheimer's disease in his mother; Colon cancer (age of onset: 36) in his maternal grandmother; Depression in his father; Heart attack (age of onset: 66) in his maternal grandfather and paternal grandfather; Kidney disease in his father. There is no history of Stroke, Diabetes, Esophageal cancer, Rectal cancer, or Stomach cancer.  ROS:   Please see the history of present illness.  All other systems reviewed and are negative.  EKGs/Labs/Other Studies Reviewed:    The following studies were reviewed today:  CT Calcium Scoring  01/15/2022: FINDINGS: Coronary arteries: Normal origins.   Coronary Calcium Score:   Left main: 55   Left anterior descending artery: 967   Left circumflex artery: 314   Right coronary artery: 917   Total: 2253   Percentile: 95th   Pericardium: Normal.   Aorta: Borderline dilated at 38 mm (non-contrast). Aortic atherosclerosis.   Non-cardiac: See separate report from [facility].   IMPRESSION: 1. Coronary calcium score of 2253. This was 95th percentile for age-, race-, and sex-matched controls. 2. Aorta: Borderline dilated at 38 mm (non-contrast). Aortic atherosclerosis.  CT Chest  06/03/2012: IMPRESSION:  1. Density on chest radiography likely reflects incidental  localized pleural/diaphragmatic thickening anteriorly along the  costophrenic sulcus.  Similar appearance on contralateral side  argues against this being significant.  2.  3 mm right lower lobe pulmonary  nodule. If the patient is at  high risk for bronchogenic carcinoma, follow-up chest CT at 1 year  is recommended.  If the patient is at low risk, no follow-up is  needed.  This recommendation follows the consensus statement:  Guidelines for Management of Small Pulmonary Nodules Detected on CT  Scans:  A Statement from the Fleischner Society as published in  Radiology 2005; 237:395-400.  3. Benign appearing subpleural lymph node along the right major  fissure.  4. Atherosclerosis and paraseptal emphysema.   EKG:  EKG is personally reviewed. EKG Interpretation Date/Time:  Wednesday April 09 2023 09:54:01 EDT Ventricular Rate:  61 PR Interval:  148 QRS Duration:  102 QT Interval:  418 QTC Calculation: 420 R Axis:   32  Text Interpretation: Normal sinus rhythm Normal ECG When compared with ECG of 27-Sep-2022 09:23, No significant change was found Confirmed by Weston Brass ([id number]) on 04/09/2023 10:23:36 AM    Recent Labs: 04/26/2022: ALT 20   Recent Lipid Panel    Component Value Date/Time   CHOL 105 08/15/2022 0845   TRIG 91 08/15/2022 0845   HDL 57 08/15/2022 0845   CHOLHDL 1.8 08/15/2022 0845   CHOLHDL 3 11/27/2021 0733   VLDL 20.0 11/27/2021 0733   LDLCALC 30 08/15/2022 0845   LDLDIRECT 125.1 01/01/2007 0000    Physical Exam:    VS:  BP 130/80 (BP Location: Left Arm, Patient Position: Sitting, Cuff Size: Normal)   Ht 5\' 10"  (1.778 m)   Wt 245 lb (111.1 kg)   BMI 35.15 kg/m     Wt Readings from Last 5 Encounters:  04/09/23 245 lb (111.1 kg)  12/02/22 237 lb (107.5 kg)  11/29/22 242 lb (109.8 kg)  09/27/22 242 lb (109.8 kg)  06/12/22 241 lb (109.3 kg)    Constitutional: No acute distress Eyes: sclera non-icteric, normal conjunctiva and lids ENMT: normal dentition, moist mucous membranes Cardiovascular: regular rhythm, bradycardic, no murmur. S1 and S2 normal. No jugular venous distention.  Respiratory: clear to auscultation bilaterally GI : normal bowel  sounds, soft and nontender. No distention.   MSK: extremities warm, well perfused. No edema.  NEURO: grossly nonfocal exam, moves all extremities. PSYCH: alert and oriented x 3, normal mood and affect.   ASSESSMENT:    1. Elevated coronary artery calcium score   2. Coronary artery disease involving native coronary artery of native heart with angina pectoris (HCC)   3. Mixed hyperlipidemia   4. Essential hypertension   5. Syncope, unspecified syncope type  6. Alcohol abuse   7. Tobacco dependence due to cigarettes     PLAN:    CAD, moderate nonobstructive on coronary CTA HLD - noted to have very high calcium score 2253 with family history and in setting of long history of smoking. CCTA with moderate nonobstructive CAD. Cont ASA 81 mg daily. - continue Crestor 10 mg daily and Repatha, tolerating both very well.  Repatha recommended given total plaque volume on CT in the extensive range (over 1000).  Adequately risk modifying at this time. Lipids very well controlled LDL 30, Trig 91 08/15/22.   #HTN - BP well controlled today on lisinopril 20 mg daily, preiouvsly on 40 mg but did not tolerate due to dizziness. Continue metoprolol succ 50 mg daily.   #Syncope - no recurrence  #tobacco use disorder #alcohol use disorder - counseled extensively on cardiovascular risk, and encouraged cessation.   Weston Brass, MD, [facility] [place]  [facility]    Medication Adjustments/Labs and Tests Ordered: Current medicines are reviewed at length with the patient today.  Concerns regarding medicines are outlined above.   Orders Placed This Encounter  Procedures   EKG 12-Lead   No orders of the defined types were placed in this encounter.  Patient Instructions  Medication Instructions:  No Changes   *If you need a refill on your cardiac medications before your next appointment, please call your pharmacy*  Lab Work: None  Follow-Up: At [facility], you and your health  needs are our priority.  As part of our continuing mission to provide you with exceptional heart care, we have created designated Provider Care Teams.  These Care Teams include your primary Cardiologist (physician) and Advanced Practice Providers (APPs -  Physician Assistants and Nurse Practitioners) who all work together to provide you with the care you need, when you need it.   Your next appointment:   1 year(s)  Provider:   Parke Poisson, MD

## 2023-04-19 ENCOUNTER — Other Ambulatory Visit: Payer: Self-pay | Admitting: Internal Medicine

## 2023-04-28 ENCOUNTER — Other Ambulatory Visit: Payer: Self-pay | Admitting: Pharmacist

## 2023-05-08 ENCOUNTER — Other Ambulatory Visit: Payer: Self-pay | Admitting: Internal Medicine

## 2023-06-02 ENCOUNTER — Encounter: Payer: Self-pay | Admitting: Internal Medicine

## 2023-06-02 ENCOUNTER — Other Ambulatory Visit (INDEPENDENT_AMBULATORY_CARE_PROVIDER_SITE_OTHER)

## 2023-06-02 DIAGNOSIS — E782 Mixed hyperlipidemia: Secondary | ICD-10-CM | POA: Diagnosis not present

## 2023-06-02 DIAGNOSIS — I10 Essential (primary) hypertension: Secondary | ICD-10-CM

## 2023-06-02 DIAGNOSIS — R7303 Prediabetes: Secondary | ICD-10-CM | POA: Diagnosis not present

## 2023-06-02 DIAGNOSIS — I2510 Atherosclerotic heart disease of native coronary artery without angina pectoris: Secondary | ICD-10-CM

## 2023-06-02 DIAGNOSIS — Z125 Encounter for screening for malignant neoplasm of prostate: Secondary | ICD-10-CM

## 2023-06-02 LAB — COMPREHENSIVE METABOLIC PANEL WITH GFR
ALT: 23 U/L (ref 0–53)
AST: 17 U/L (ref 0–37)
Albumin: 4.4 g/dL (ref 3.5–5.2)
Alkaline Phosphatase: 61 U/L (ref 39–117)
BUN: 21 mg/dL (ref 6–23)
CO2: 26 meq/L (ref 19–32)
Calcium: 9.3 mg/dL (ref 8.4–10.5)
Chloride: 104 meq/L (ref 96–112)
Creatinine, Ser: 1.04 mg/dL (ref 0.40–1.50)
GFR: 72.62 mL/min (ref >60.00)
Glucose, Bld: 111 mg/dL — ABNORMAL HIGH (ref 70–99)
Potassium: 4.8 meq/L (ref 3.5–5.1)
Sodium: 139 meq/L (ref 135–145)
Total Bilirubin: 0.5 mg/dL (ref 0.2–1.2)
Total Protein: 6.9 g/dL (ref 6.0–8.3)

## 2023-06-02 LAB — LIPID PANEL
Cholesterol: 105 mg/dL (ref 0–200)
HDL: 56.7 mg/dL (ref >39.00)
LDL Cholesterol: 32 mg/dL (ref 0–99)
NonHDL: 48.45
Total CHOL/HDL Ratio: 2
Triglycerides: 81 mg/dL (ref 0.0–149.0)
VLDL: 16.2 mg/dL (ref 0.0–40.0)

## 2023-06-02 LAB — CBC WITH DIFFERENTIAL/PLATELET
Basophils Absolute: 0.1 10*3/uL (ref 0.0–0.1)
Basophils Relative: 1.1 % (ref 0.0–3.0)
Eosinophils Absolute: 0.2 10*3/uL (ref 0.0–0.7)
Eosinophils Relative: 3.4 % (ref 0.0–5.0)
HCT: 43.3 % (ref 39.0–52.0)
Hemoglobin: 14.7 g/dL (ref 13.0–17.0)
Lymphocytes Relative: 33.9 % (ref 12.0–46.0)
Lymphs Abs: 2.4 10*3/uL (ref 0.7–4.0)
MCHC: 34.1 g/dL (ref 30.0–36.0)
MCV: 101.9 fl — ABNORMAL HIGH (ref 78.0–100.0)
Monocytes Absolute: 0.7 10*3/uL (ref 0.1–1.0)
Monocytes Relative: 9.5 % (ref 3.0–12.0)
Neutro Abs: 3.7 10*3/uL (ref 1.4–7.7)
Neutrophils Relative %: 52.1 % (ref 43.0–77.0)
Platelets: 191 10*3/uL (ref 150.0–400.0)
RBC: 4.25 Mil/uL (ref 4.22–5.81)
RDW: 13.7 % (ref 11.5–15.5)
WBC: 7.2 10*3/uL (ref 4.0–10.5)

## 2023-06-02 LAB — TSH: TSH: 1.28 u[IU]/mL (ref 0.35–5.50)

## 2023-06-02 LAB — HEMOGLOBIN A1C: Hgb A1c MFr Bld: 5.7 % (ref 4.6–6.5)

## 2023-06-02 LAB — PSA, MEDICARE: PSA: 1.75 ng/mL (ref 0.10–4.00)

## 2023-06-02 NOTE — Patient Instructions (Addendum)
      Blood work was ordered.       Medications changes include :   None     Return in about 6 months (around 12/04/2023) for welcome to medicare .

## 2023-06-02 NOTE — Progress Notes (Unsigned)
 Subjective:    Patient ID: Andrew Woodard, male    DOB: 1950/05/04, 73 y.o.   MRN: [id number]     HPI Andrew Woodard is here for follow up of his chronic medical problems.  Ears started ringing 3-4 months ago.  Hearing is good he thinks.    Exercising some.  Playing golf, cutting grass.  Gets 6000-8000 steps a day.   Eating well.    Smoking and drinking less wine.    Medications and allergies reviewed with patient and updated if appropriate.  Current Outpatient Medications on File Prior to Visit  Medication Sig Dispense Refill   aspirin 81 MG chewable tablet Chew by mouth daily.     Evolocumab  (REPATHA  SURECLICK) 140 MG/ML SOAJ INJECT 140MG  SUBCUTANEOUSLY EVERY  14  DAYS 6 mL 3   lisinopril  (ZESTRIL ) 20 MG tablet Take 1 tablet by mouth once daily 90 tablet 0   meloxicam  (MOBIC ) 15 MG tablet Take 1 tablet (15 mg total) by mouth daily. 90 tablet 3   metoprolol  succinate (TOPROL -XL) 50 MG 24 hr tablet Take 1 tablet (50 mg total) by mouth daily. Take with or immediately following a meal. 90 tablet 1   rosuvastatin  (CRESTOR ) 10 MG tablet Take 1 tablet (10 mg total) by mouth daily. 90 tablet 3   sertraline  (ZOLOFT ) 100 MG tablet Take 1 tablet (100 mg total) by mouth daily. 90 tablet 1   tadalafil  (CIALIS ) 20 MG tablet Take 0.5-1 tablets (10-20 mg total) by mouth every other day as needed for erectile dysfunction. 10 tablet 11   No current facility-administered medications on file prior to visit.     Review of Systems  Constitutional:  Negative for fever.  Respiratory:  Positive for cough (smoking). Negative for shortness of breath and wheezing.   Cardiovascular:  Negative for chest pain, palpitations and leg swelling.  Neurological:  Negative for light-headedness and headaches.       Objective:   Vitals:   06/03/23 1115  BP: 126/76  Pulse: 64  Temp: 98.2 F (36.8 C)  SpO2: 98%   BP Readings from Last 3 Encounters:  06/03/23 126/76  04/09/23 130/80  12/02/22 124/80    Wt Readings from Last 3 Encounters:  06/03/23 242 lb (109.8 kg)  04/09/23 245 lb (111.1 kg)  12/02/22 237 lb (107.5 kg)   Body mass index is 34.72 kg/m.    Physical Exam Constitutional:      General: He is not in acute distress.    Appearance: Normal appearance. He is not ill-appearing.  HENT:     Head: Normocephalic and atraumatic.  Eyes:     Conjunctiva/sclera: Conjunctivae normal.  Cardiovascular:     Rate and Rhythm: Normal rate and regular rhythm.     Heart sounds: Normal heart sounds.  Pulmonary:     Effort: Pulmonary effort is normal. No respiratory distress.     Breath sounds: Normal breath sounds. No wheezing or rales.  Musculoskeletal:     Right lower leg: No edema.     Left lower leg: No edema.  Skin:    General: Skin is warm and dry.     Findings: No rash.  Neurological:     Mental Status: He is alert. Mental status is at baseline.  Psychiatric:        Mood and Affect: Mood normal.        Lab Results  Component Value Date   WBC 7.2 06/02/2023   HGB 14.7 06/02/2023   HCT 43.3  06/02/2023   PLT 191.0 06/02/2023   GLUCOSE 111 (H) 06/02/2023   CHOL 105 06/02/2023   TRIG 81.0 06/02/2023   HDL 56.70 06/02/2023   LDLDIRECT 125.1 01/01/2007   LDLCALC 32 06/02/2023   ALT 23 06/02/2023   AST 17 06/02/2023   NA 139 06/02/2023   K 4.8 06/02/2023   CL 104 06/02/2023   CREATININE 1.04 06/02/2023   BUN 21 06/02/2023   CO2 26 06/02/2023   TSH 1.28 06/02/2023   PSA 1.75 06/02/2023   HGBA1C 5.7 06/02/2023     Assessment & Plan:    See Problem List for Assessment and Plan of chronic medical problems.

## 2023-06-03 ENCOUNTER — Ambulatory Visit (INDEPENDENT_AMBULATORY_CARE_PROVIDER_SITE_OTHER): Payer: Medicare HMO | Admitting: Internal Medicine

## 2023-06-03 VITALS — BP 126/76 | HR 64 | Temp 98.2°F | Ht 70.0 in | Wt 242.0 lb

## 2023-06-03 DIAGNOSIS — N5201 Erectile dysfunction due to arterial insufficiency: Secondary | ICD-10-CM

## 2023-06-03 DIAGNOSIS — I10 Essential (primary) hypertension: Secondary | ICD-10-CM

## 2023-06-03 DIAGNOSIS — H9313 Tinnitus, bilateral: Secondary | ICD-10-CM | POA: Insufficient documentation

## 2023-06-03 DIAGNOSIS — I2510 Atherosclerotic heart disease of native coronary artery without angina pectoris: Secondary | ICD-10-CM

## 2023-06-03 DIAGNOSIS — R7303 Prediabetes: Secondary | ICD-10-CM

## 2023-06-03 DIAGNOSIS — E782 Mixed hyperlipidemia: Secondary | ICD-10-CM | POA: Diagnosis not present

## 2023-06-03 NOTE — Assessment & Plan Note (Addendum)
 Chronic Blood pressure well controlled CBC, CMP reviewed Monitor blood pressure at home Continue lisinopril  20 mg daily, metoprolol  XL 50 mg daily

## 2023-06-03 NOTE — Assessment & Plan Note (Signed)
 Chronic Continue Cialis 10-20 mg daily as needed If this is not effective can try Viagra

## 2023-06-03 NOTE — Assessment & Plan Note (Signed)
 New Experiencing ringing in 1 or both ears May have some hearing loss, but does not feel it is anything significant Monitor for now-can refer to audiology

## 2023-06-03 NOTE — Assessment & Plan Note (Signed)
 Chronic Continue Crestor  10 mg daily, Repatha  140 mg q. 14 days Lab Results  Component Value Date   LDLCALC 32 06/02/2023    He has made tremendous changes in his diet and is exercising-will work on increasing his exercise.

## 2023-06-03 NOTE — Assessment & Plan Note (Signed)
 Chronic Lab Results  Component Value Date   HGBA1C 5.7 06/02/2023   Has improved his diet and is exercising Stressed low sugar/carbohydrate diet and regular exercise

## 2023-06-03 NOTE — Assessment & Plan Note (Signed)
 Chronic Seen on CT scan-CT CAC 2253 Asymptomatic Following with cardiology On Repatha , rosuvastatin , aspirin 81 mg

## 2023-06-13 ENCOUNTER — Other Ambulatory Visit: Payer: Self-pay

## 2023-06-13 ENCOUNTER — Emergency Department (HOSPITAL_BASED_OUTPATIENT_CLINIC_OR_DEPARTMENT_OTHER)
Admission: EM | Admit: 2023-06-13 | Discharge: 2023-06-13 | Disposition: A | Discharge status: Home / Self Care | Attending: Emergency Medicine | Admitting: Emergency Medicine

## 2023-06-13 ENCOUNTER — Encounter (HOSPITAL_BASED_OUTPATIENT_CLINIC_OR_DEPARTMENT_OTHER): Payer: Self-pay | Admitting: Emergency Medicine

## 2023-06-13 ENCOUNTER — Emergency Department (HOSPITAL_BASED_OUTPATIENT_CLINIC_OR_DEPARTMENT_OTHER)

## 2023-06-13 DIAGNOSIS — I10 Essential (primary) hypertension: Secondary | ICD-10-CM | POA: Diagnosis not present

## 2023-06-13 DIAGNOSIS — K561 Intussusception: Secondary | ICD-10-CM | POA: Diagnosis not present

## 2023-06-13 DIAGNOSIS — Z7982 Long term (current) use of aspirin: Secondary | ICD-10-CM | POA: Insufficient documentation

## 2023-06-13 DIAGNOSIS — R748 Abnormal levels of other serum enzymes: Secondary | ICD-10-CM | POA: Insufficient documentation

## 2023-06-13 DIAGNOSIS — R911 Solitary pulmonary nodule: Secondary | ICD-10-CM | POA: Insufficient documentation

## 2023-06-13 DIAGNOSIS — Z79899 Other long term (current) drug therapy: Secondary | ICD-10-CM | POA: Diagnosis not present

## 2023-06-13 DIAGNOSIS — N400 Enlarged prostate without lower urinary tract symptoms: Secondary | ICD-10-CM | POA: Diagnosis not present

## 2023-06-13 DIAGNOSIS — K279 Peptic ulcer, site unspecified, unspecified as acute or chronic, without hemorrhage or perforation: Secondary | ICD-10-CM | POA: Insufficient documentation

## 2023-06-13 DIAGNOSIS — R1013 Epigastric pain: Secondary | ICD-10-CM | POA: Diagnosis not present

## 2023-06-13 DIAGNOSIS — N281 Cyst of kidney, acquired: Secondary | ICD-10-CM | POA: Diagnosis not present

## 2023-06-13 LAB — COMPREHENSIVE METABOLIC PANEL WITH GFR
ALT: 20 U/L (ref 0–44)
AST: 19 U/L (ref 15–41)
Albumin: 4.2 g/dL (ref 3.5–5.0)
Alkaline Phosphatase: 64 U/L (ref 38–126)
Anion gap: 12 (ref 5–15)
BUN: 19 mg/dL (ref 8–23)
CO2: 25 mmol/L (ref 22–32)
Calcium: 10.5 mg/dL — ABNORMAL HIGH (ref 8.9–10.3)
Chloride: 99 mmol/L (ref 98–111)
Creatinine, Ser: 0.95 mg/dL (ref 0.61–1.24)
GFR, Estimated: >60 mL/min (ref >60)
Glucose, Bld: 140 mg/dL — ABNORMAL HIGH (ref 70–99)
Potassium: 4 mmol/L (ref 3.5–5.1)
Sodium: 136 mmol/L (ref 135–145)
Total Bilirubin: 0.5 mg/dL (ref 0.0–1.2)
Total Protein: 6.8 g/dL (ref 6.5–8.1)

## 2023-06-13 LAB — CBC
HCT: 39.8 % (ref 39.0–52.0)
Hemoglobin: 13.8 g/dL (ref 13.0–17.0)
MCH: 34.2 pg — ABNORMAL HIGH (ref 26.0–34.0)
MCHC: 34.7 g/dL (ref 30.0–36.0)
MCV: 98.8 fL (ref 80.0–100.0)
Platelets: 187 10*3/uL (ref 150–400)
RBC: 4.03 MIL/uL — ABNORMAL LOW (ref 4.22–5.81)
RDW: 12.7 % (ref 11.5–15.5)
WBC: 10.3 10*3/uL (ref 4.0–10.5)
nRBC: 0 % (ref 0.0–0.2)

## 2023-06-13 LAB — URINALYSIS, ROUTINE W REFLEX MICROSCOPIC
Bacteria, UA: NONE SEEN
Bilirubin Urine: NEGATIVE
Glucose, UA: NEGATIVE mg/dL
Ketones, ur: NEGATIVE mg/dL
Leukocytes,Ua: NEGATIVE
Nitrite: NEGATIVE
Specific Gravity, Urine: 1.033 — ABNORMAL HIGH (ref 1.005–1.030)
pH: 6 (ref 5.0–8.0)

## 2023-06-13 LAB — LIPASE, BLOOD: Lipase: 90 U/L — ABNORMAL HIGH (ref 11–51)

## 2023-06-13 MED ORDER — PANTOPRAZOLE SODIUM 20 MG PO TBEC: 20.0000 mg | DELAYED_RELEASE_TABLET | Freq: Every day | ORAL | 0 refills | Status: DC

## 2023-06-13 MED ORDER — ALUM & MAG HYDROXIDE-SIMETH 200-200-20 MG/5ML PO SUSP
30.0000 mL | Freq: Once | ORAL | Status: AC
  Administered 2023-06-13: 10 mL via ORAL
  Filled 2023-06-13: qty 30

## 2023-06-13 MED ORDER — PANTOPRAZOLE SODIUM 40 MG IV SOLR
40.0000 mg | Freq: Once | INTRAVENOUS | Status: AC
  Administered 2023-06-13: 40 mg via INTRAVENOUS
  Filled 2023-06-13: qty 10

## 2023-06-13 MED ORDER — ONDANSETRON 4 MG PO TBDP: 4.0000 mg | ORAL_TABLET | Freq: Three times a day (TID) | ORAL | 0 refills | Status: AC | PRN

## 2023-06-13 MED ORDER — LACTATED RINGERS IV BOLUS
1000.0000 mL | Freq: Once | INTRAVENOUS | Status: AC
  Administered 2023-06-13: 1000 mL via INTRAVENOUS

## 2023-06-13 MED ORDER — SUCRALFATE 1 G PO TABS: 1.0000 g | ORAL_TABLET | Freq: Three times a day (TID) | ORAL | 0 refills | Status: DC

## 2023-06-13 MED ORDER — ONDANSETRON HCL 4 MG/2ML IJ SOLN
4.0000 mg | Freq: Once | INTRAMUSCULAR | Status: AC
  Administered 2023-06-13: 4 mg via INTRAVENOUS
  Filled 2023-06-13: qty 2

## 2023-06-13 MED ORDER — IOHEXOL 300 MG/ML  SOLN
100.0000 mL | Freq: Once | INTRAMUSCULAR | Status: AC | PRN
  Administered 2023-06-13: 100 mL via INTRAVENOUS

## 2023-06-13 MED ORDER — MORPHINE SULFATE (PF) 4 MG/ML IV SOLN
4.0000 mg | Freq: Once | INTRAVENOUS | Status: AC
  Administered 2023-06-13: 4 mg via INTRAVENOUS
  Filled 2023-06-13: qty 1

## 2023-06-13 MED ORDER — SUCRALFATE 1 G PO TABS
1.0000 g | ORAL_TABLET | Freq: Once | ORAL | Status: AC
  Administered 2023-06-13: 1 g via ORAL
  Filled 2023-06-13: qty 1

## 2023-06-13 NOTE — ED Provider Notes (Signed)
 Care assumed at shift change. Patient still with some discomfort but ready go to home. Rx and GI follow up as per Dr. Allyn Arenas recommendations.    Andrew Cooper, MD 06/13/23 [phone]

## 2023-06-13 NOTE — ED Provider Notes (Signed)
 [place] [facility]GENC[facility] DEPARTMENT AT [facility] Provider Note   CSN: [id number] Arrival date & time: 06/13/23  1833     History  Chief Complaint  Patient presents with   Abdominal Pain    Andrew Woodard is a 73 y.o. male.  Patient is a 73 year old male with a history of hypertension, hyperlipidemia who is presenting today with 3 days of abdominal pain.  He reports he has just had AN ache in his upper abdomen in the epigastric area that has gradually worsened and today he had several episodes of emesis.  Emesis is nonbloody and nonbilious.  Bowel movements have been normal without any blood.  No prior abdominal surgeries.  He has not had much to eat today but is not sure that eating has made it any worse.  Patient does drink about a bottle of wine a day as well as takes meloxicam  every day for years.  No prior history of pancreatitis or stomach issues.  The history is provided by the patient, the spouse and medical records.  Abdominal Pain      Home Medications Prior to Admission medications   Medication Sig Start Date End Date Taking? Authorizing Provider  pantoprazole (PROTONIX) 20 MG tablet Take 1 tablet (20 mg total) by mouth daily. 06/13/23  Yes Almond Army, MD  sucralfate (CARAFATE) 1 g tablet Take 1 tablet (1 g total) by mouth 4 (four) times daily -  with meals and at bedtime. 06/13/23  Yes Almond Army, MD  aspirin 81 MG chewable tablet Chew by mouth daily.    [provider]  Evolocumab  (REPATHA  SURECLICK) 140 MG/ML SOAJ INJECT 140MG  SUBCUTANEOUSLY EVERY  14  DAYS 04/28/23   Acharya, Gayatri A, MD  lisinopril  (ZESTRIL ) 20 MG tablet Take 1 tablet by mouth once daily 05/08/23   Colene Dauphin, MD  meloxicam  (MOBIC ) 15 MG tablet Take 1 tablet (15 mg total) by mouth daily. 12/02/22   Colene Dauphin, MD  metoprolol  succinate (TOPROL -XL) 50 MG 24 hr tablet Take 1 tablet (50 mg total) by mouth daily. Take with or immediately following a meal. 12/02/22   Burns,  Beckey Bourgeois, MD  rosuvastatin  (CRESTOR ) 10 MG tablet Take 1 tablet (10 mg total) by mouth daily. 07/12/22   Acharya, Gayatri A, MD  sertraline  (ZOLOFT ) 100 MG tablet Take 1 tablet (100 mg total) by mouth daily. 12/02/22   Colene Dauphin, MD  tadalafil  (CIALIS ) 20 MG tablet Take 0.5-1 tablets (10-20 mg total) by mouth every other day as needed for erectile dysfunction. 12/02/22   Colene Dauphin, MD      Allergies    Penicillins and Wellbutrin [bupropion]    Review of Systems   Review of Systems  Gastrointestinal:  Positive for abdominal pain.    Physical Exam Updated Vital Signs BP (!) 159/98   Pulse 62   Temp 98.1 F (36.7 C)   Resp 20   SpO2 95%  Physical Exam Vitals and nursing note reviewed.  Constitutional:      General: He is not in acute distress.    Appearance: He is well-developed.  HENT:     Head: Normocephalic and atraumatic.  Eyes:     Conjunctiva/sclera: Conjunctivae normal.     Pupils: Pupils are equal, round, and reactive to light.  Cardiovascular:     Rate and Rhythm: Normal rate and regular rhythm.     Heart sounds: No murmur heard. Pulmonary:     Effort: Pulmonary effort is normal. No respiratory distress.  Breath sounds: Normal breath sounds. No wheezing or rales.  Abdominal:     General: There is no distension.     Palpations: Abdomen is soft.     Tenderness: There is abdominal tenderness in the epigastric area. There is guarding. There is no right CVA tenderness, left CVA tenderness or rebound.  Musculoskeletal:        General: No tenderness. Normal range of motion.     Cervical back: Normal range of motion and neck supple.  Skin:    General: Skin is warm and dry.     Findings: No erythema or rash.  Neurological:     Mental Status: He is alert and oriented to person, place, and time.  Psychiatric:        Behavior: Behavior normal.     ED Results / Procedures / Treatments   Labs (all labs ordered are listed, but only abnormal results are  displayed) Labs Reviewed  LIPASE, BLOOD - Abnormal; Notable for the following components:      Result Value   Lipase 90 (*)    All other components within normal limits  COMPREHENSIVE METABOLIC PANEL WITH GFR - Abnormal; Notable for the following components:   Glucose, Bld 140 (*)    Calcium  10.5 (*)    All other components within normal limits  CBC - Abnormal; Notable for the following components:   RBC 4.03 (*)    MCH 34.2 (*)    All other components within normal limits  URINALYSIS, ROUTINE W REFLEX MICROSCOPIC - Abnormal; Notable for the following components:   Specific Gravity, Urine 1.033 (*)    Hgb urine dipstick SMALL (*)    Protein, ur TRACE (*)    All other components within normal limits    EKG EKG Interpretation Date/Time:  Friday Jun 13 2023 18:52:02 EDT Ventricular Rate:  78 PR Interval:  160 QRS Duration:  111 QT Interval:  377 QTC Calculation: 430 R Axis:   74  Text Interpretation: Sinus rhythm Low voltage, precordial leads No significant change since last tracing Confirmed by Almond Army ([id number]) on 06/13/2023 7:58:53 PM  Radiology CT ABDOMEN PELVIS W CONTRAST Result Date: 06/13/2023 CLINICAL DATA:  Epigastric pain x2 days. EXAM: CT ABDOMEN AND PELVIS WITH CONTRAST TECHNIQUE: Multidetector CT imaging of the abdomen and pelvis was performed using the standard protocol following bolus administration of intravenous contrast. RADIATION DOSE REDUCTION: This exam was performed according to the departmental dose-optimization program which includes automated exposure control, adjustment of the mA and/or kV according to patient size and/or use of iterative reconstruction technique. CONTRAST:  OMNIPAQUE  IOHEXOL  300 MG/ML  SOLN COMPARISON:  February 07, 2022 FINDINGS: Lower chest: A stable 6 mm anterolateral right lower lobe pulmonary nodule versus focal scar is seen (axial CT image 5, CT series 4). Hepatobiliary: No focal liver abnormality is seen. No gallstones,  gallbladder wall thickening, or biliary dilatation. Pancreas: Unremarkable. No pancreatic ductal dilatation or surrounding inflammatory changes. Spleen: Normal in size without focal abnormality. Adrenals/Urinary Tract: Adrenal glands are unremarkable. Kidneys are normal in size, without renal calculi or hydronephrosis. A 5.6 cm diameter simple cyst is seen within the left kidney. Bladder is unremarkable. Stomach/Bowel: There is moderate to marked severity, asymmetric, anterior, low attenuation thickening of the gastric antrum and duodenal bulb (axial CT images 22 through 30, CT series 3/coronal reformatted images 75 through 96, CT series 5). A short segment of intussusception duodenal bulb/proximal duodenum is also seen within this region (axial CT image 27, CT series 3/sagittal  reformatted images 75 through 83, CT series 6). Appendix appears normal. No evidence of bowel dilatation. Vascular/Lymphatic: Aortic atherosclerosis. No enlarged abdominal or pelvic lymph nodes. Reproductive: Prostate gland is mildly enlarged. Other: No abdominal wall hernia or abnormality. No abdominopelvic ascites. Musculoskeletal: Multilevel degenerative changes seen throughout the lumbar spine. IMPRESSION: 1. Asymmetric thickening of the gastric antrum and duodenal bulb, with a short segment of intussusception of the duodenal bulb/proximal duodenum. While this may be infectious or inflammatory in etiology, the presence of an underlying neoplasm cannot be excluded. Further evaluation with endoscopy is recommended. 2. Stable 6 mm anterolateral right lower lobe pulmonary nodule versus focal scar. Follow-up CT at 18-24 months (from today's scan) is considered optional for low-risk patients, but is recommended for high-risk patients. This recommendation follows the consensus statement: Guidelines for Management of Incidental Pulmonary Nodules Detected on CT Images: From the Fleischner Society 2017; Radiology 2017; 284:228-243. 3. 5.6 cm  diameter simple left renal cyst. No follow-up imaging is recommended. This recommendation follows ACR consensus guidelines: Management of the Incidental Renal Mass on CT: A White Paper of the ACR Incidental Findings Committee. J Am Coll Radiol [phone]. 4. Aortic atherosclerosis. Electronically Signed   By: Virgle Grime M.D.   On: 06/13/2023 21:25    Procedures Procedures    Medications Ordered in ED Medications  lactated ringers bolus 1,000 mL (0 mLs Intravenous Stopped 06/13/23 2216)  ondansetron (ZOFRAN) injection 4 mg (4 mg Intravenous Given 06/13/23 2051)  iohexol  (OMNIPAQUE ) 300 MG/ML solution 100 mL (100 mLs Intravenous Contrast Given 06/13/23 2108)  alum & mag hydroxide-simeth (MAALOX/MYLANTA) 200-200-20 MG/5ML suspension 30 mL (10 mLs Oral Given 06/13/23 2238)  pantoprazole (PROTONIX) injection 40 mg (40 mg Intravenous Given 06/13/23 2215)  lactated ringers bolus 1,000 mL (1,000 mLs Intravenous New Bag/Given 06/13/23 2254)  sucralfate (CARAFATE) tablet 1 g (1 g Oral Given 06/13/23 2252)  morphine (PF) 4 MG/ML injection 4 mg (4 mg Intravenous Given 06/13/23 2255)    ED Course/ Medical Decision Making/ A&P                                 Medical Decision Making Amount and/or Complexity of Data Reviewed Labs: ordered. Decision-making details documented in ED Course. Radiology: ordered and independent interpretation performed. Decision-making details documented in ED Course.  Risk OTC drugs. Prescription drug management.   Pt with multiple medical problems and comorbidities and presenting today with a complaint that caries a high risk for morbidity and mortality.  Here today with complaint of abdominal pain.  Patient is hemodynamically stable and low suspicion for AAA at this time or cardiac causes.  Concern for pancreatitis, gastritis/PUD.  Symptoms less concerning for perforation, Penta situs, diverticulitis, obstruction, cholecystitis.  Patient has no urinary symptoms  that would suggest urinary retention, renal stone or UTI.  I independently interpretedPatient's labs patient's labs and lipase is mildly elevated at 90, CMP without acute findings, CBC within normal limits, UA without acute findings.  I have independently visualized and interpreted pt's images today.  CT without significant evidence of pancreatitis or bowel obstruction.  No renal stones.  Radiology reports asymmetric thickening of the gastric antrum and duodenal bulb with a short segment of intussusception of the duodenal bulb proximal duodenum.  While this may be infectious or inflammatory they cannot exclude a neoplasm and patient will need an endoscopy in the future.  Also he has a 6 mm right lower lobe pulmonary nodule that  they recommend follow-up in 18 to 24 months if patient is high risk.  Patient was given Protonix, fluids and antiemetics.  Also given a carafate.  Will treat with PPI, cessation of the meloxicam  and encouraged him to decrease wine use. Patient still having some pain and we will give further fluids and pain control.        Final Clinical Impression(s) / ED Diagnoses Final diagnoses:  PUD (peptic ulcer disease)  Lung nodule, solitary    Rx / DC Orders ED Discharge Orders          Ordered    pantoprazole (PROTONIX) 20 MG tablet  Daily        06/13/23 2313    sucralfate (CARAFATE) 1 g tablet  3 times daily with meals & bedtime        06/13/23 2313              Almond Army, MD 06/13/23 2315

## 2023-06-13 NOTE — Discharge Instructions (Signed)
 The CAT scan today showed signs concerning for an ulcer in your stomach.  You will need to stop meloxicam  and avoid any ibuprofen, Aleve, naproxen or aspirin products.  Try to decrease the amount of wine you are drinking for the next few weeks and avoid any foods that are spicy or acidic.  For the next 24 to 48 hours eat a very bland diet with broths, Jell-O, toast, rice, bananas.  If you develop significant vomiting, pain is worsening, fever or other concerns return to the emergency room

## 2023-06-13 NOTE — ED Triage Notes (Signed)
 Epigastric pain Started 2 days ago Some vomiting Burning, squeezing Denies sob

## 2023-06-13 NOTE — ED Notes (Signed)
 Pt advises no change in pain level. EDP at bedside for eval

## 2023-06-16 ENCOUNTER — Encounter: Payer: Self-pay | Admitting: Internal Medicine

## 2023-06-27 ENCOUNTER — Other Ambulatory Visit: Payer: Self-pay | Admitting: Internal Medicine

## 2023-07-04 ENCOUNTER — Other Ambulatory Visit: Payer: Self-pay

## 2023-07-04 MED ORDER — ROSUVASTATIN CALCIUM 10 MG PO TABS: 10.0000 mg | ORAL_TABLET | Freq: Every day | ORAL | 2 refills | Status: AC

## 2023-07-15 ENCOUNTER — Ambulatory Visit: Admitting: Internal Medicine

## 2023-07-15 ENCOUNTER — Encounter: Payer: Self-pay | Admitting: Internal Medicine

## 2023-07-15 VITALS — BP 118/70 | HR 70 | Ht 70.0 in | Wt 234.0 lb

## 2023-07-15 DIAGNOSIS — F1010 Alcohol abuse, uncomplicated: Secondary | ICD-10-CM

## 2023-07-15 DIAGNOSIS — R1013 Epigastric pain: Secondary | ICD-10-CM

## 2023-07-15 DIAGNOSIS — K529 Noninfective gastroenteritis and colitis, unspecified: Secondary | ICD-10-CM | POA: Diagnosis not present

## 2023-07-15 DIAGNOSIS — R933 Abnormal findings on diagnostic imaging of other parts of digestive tract: Secondary | ICD-10-CM

## 2023-07-15 DIAGNOSIS — Z1211 Encounter for screening for malignant neoplasm of colon: Secondary | ICD-10-CM

## 2023-07-15 DIAGNOSIS — R197 Diarrhea, unspecified: Secondary | ICD-10-CM

## 2023-07-15 DIAGNOSIS — R935 Abnormal findings on diagnostic imaging of other abdominal regions, including retroperitoneum: Secondary | ICD-10-CM

## 2023-07-15 MED ORDER — NA SULFATE-K SULFATE-MG SULF 17.5-3.13-1.6 GM/177ML PO SOLN: 1.0000 | Freq: Once | ORAL | 0 refills | Status: AC

## 2023-07-15 MED ORDER — PANTOPRAZOLE SODIUM 40 MG PO TBEC: 40.0000 mg | DELAYED_RELEASE_TABLET | Freq: Every day | ORAL | 3 refills | Status: DC

## 2023-07-15 NOTE — Patient Instructions (Signed)
 We have sent the following medications to your pharmacy for you to pick up at your convenience: Protonix  40mg .  You have been scheduled for an endoscopy and colonoscopy. Please follow the written instructions given to you at your visit today.  If you use inhalers (even only as needed), please bring them with you on the day of your procedure.  DO NOT TAKE 7 DAYS PRIOR TO TEST- Trulicity (dulaglutide) Ozempic, Wegovy (semaglutide) Mounjaro (tirzepatide) Bydureon Bcise (exanatide extended release)  DO NOT TAKE 1 DAY PRIOR TO YOUR TEST Rybelsus (semaglutide) Adlyxin (lixisenatide) Victoza (liraglutide) Byetta (exanatide) ___________________________________________________________________________ _______________________________________________________  If your blood pressure at your visit was 140/90 or greater, please contact your primary care physician to follow up on this.  _______________________________________________________  If you are age 46 or older, your body mass index should be between 23-30. Your Body mass index is 33.58 kg/m. If this is out of the aforementioned range listed, please consider follow up with your Primary Care Provider.  If you are age 63 or younger, your body mass index should be between 19-25. Your Body mass index is 33.58 kg/m. If this is out of the aformentioned range listed, please consider follow up with your Primary Care Provider.   ________________________________________________________  The [facility] providers would like to encourage you to use MYCHART to communicate with providers for non-urgent requests or questions.  Due to long hold times on the telephone, sending your provider a message by [facility] may be a faster and more efficient way to get a response.  Please allow 48 business hours for a response.  Please remember that this is for non-urgent requests.  _______________________________________________________

## 2023-07-15 NOTE — Progress Notes (Signed)
 HISTORY OF PRESENT ILLNESS:  Andrew Woodard is a 73 y.o. male, retired Control and instrumentation engineer and prior acquaintance of the Garrel Degree, who presents today regarding abdominal pain and abnormal CT scan.  The patient was last seen in this office January 23, 2018 for regarding chronic diarrhea with urgency and colon cancer screening.  Celiac testing was negative.  He was scheduled for colonoscopy but canceled and did not reschedule.  For his diarrhea, he tells me that he takes Imodium as needed.  This helps.  Without Imodium, issues with diarrhea and urgency.  Next, he tells me that he developed severe upper abdominal pain which brought him to the emergency room Jun 13, 2023.  Reviewed.  At that time he reported drinking a bottle of wine per day as well as meloxicam  daily for years.  He was told to discontinue NSAIDs.  A CT scan of the abdomen and pelvis was performed that day.  He was noted to have asymmetric thickening of the gastric antrum and bulb with short segment intussusception of the duodenal bulb/proximal duodenum.  Concerns were infectious, inflammatory, or neoplastic.  Endoscopy recommended.  Patient tells me that he has been taking sucralfate  which was prescribed as well as pantoprazole  somewhat sporadically.  Still with some upper abdominal discomfort, though less.  He did vomit 1 time on presentation.  None since.  Denies weight loss.  He is concerned.  Taking Tylenol  for pain.  Blood work from Jun 13, 2023 shows mildly elevated lipase of 90.  Calcium  10.5.  Other electrolytes unremarkable.  Normal liver test.  CBC revealed white blood cell count of 10.3.  Hemoglobin 13.8.  Last colonoscopy May 2014 revealed diverticulosis.  Otherwise normal.  Prior colonoscopy in 2006 was also negative for neoplasia  REVIEW OF SYSTEMS:  All non-GI ROS negative unless otherwise stated in the HPI except for anxiety, arthritis, back pain, depression  Past Medical History:  Diagnosis Date   ADD (attention  deficit disorder)    Allergy    Arthritis    Colon polyps    Depression    Fasting hyperglycemia    Hyperlipidemia    Hypertension    Pneumonia     Past Surgical History:  Procedure Laterality Date   ABSCESS DRAINAGE  2012   L thigh, Dr Mikell (not MRSA)   COLONOSCOPY  2014   colonoscopy with polypectomy  2007   Dr Abran   CYSTECTOMY  2011   L knee, Dr Shari   [facility]  2013    Dr Eldonna   ganglionectomy     PILONIDAL CYST EXCISION     X 2   VASECTOMY     X 2; Dr Ottelin    Social History [name]  reports that he has been smoking cigarettes. He has never used smokeless tobacco. He reports current alcohol use. He reports that he does not use drugs.  family history includes Alzheimer's disease in his mother; Colon cancer (age of onset: 3) in his maternal grandmother; Depression in his father; Heart attack (age of onset: 71) in his maternal grandfather and paternal grandfather; Kidney disease in his father.  Allergies  Allergen Reactions   Penicillins     Throat swelled   Wellbutrin [Bupropion]     Felt bad on it       PHYSICAL EXAMINATION: Vital signs: BP 118/70   Pulse 70   Ht 5' 10 (1.778 m)   Wt 234 lb (106.1 kg)   SpO2 97%   BMI 33.58 kg/m  Constitutional: generally well-appearing, no acute distress Psychiatric: alert and oriented x3, cooperative Eyes: extraocular movements intact, anicteric, conjunctiva pink Mouth: oral pharynx moist, no lesions Neck: supple no lymphadenopathy Cardiovascular: heart regular rate and rhythm, no murmur Lungs: clear to auscultation bilaterally Abdomen: soft, nontender, nondistended, no obvious ascites, no peritoneal signs, normal bowel sounds, no organomegaly Rectal: Deferred until colonoscopy Extremities: no clubbing, cyanosis, or lower extremity edema bilaterally Skin: no lesions on visible extremities Neuro: No focal deficits. No asterixis.    ASSESSMENT:  1.  Upper abdominal pain 2.  Abnormal CT scan  with asymmetric thickening of the gastric antrum and duodenal bulb. 3.  Chronic alcohol use 4.  Mildly elevated lipase 5.  Chronic diarrhea 6.  Colon cancer screening   PLAN:  1.  Prescribe pantoprazole  40 mg daily. 2.  Medication risk reviewed 3.  Curb alcohol 4.  Continue to avoid NSAIDs 5.  Schedule upper endoscopy to evaluate abdominal pain and abnormal CT.The nature of the procedure, as well as the risks, benefits, and alternatives were carefully and thoroughly reviewed with the patient. Ample time for discussion and questions allowed. The patient understood, was satisfied, and agreed to proceed. 6.  Schedule colonoscopy for colon cancer screening and to evaluate chronic diarrhea.The nature of the procedure, as well as the risks, benefits, and alternatives were carefully and thoroughly reviewed with the patient. Ample time for discussion and questions allowed. The patient understood, was satisfied, and agreed to proceed. 7.  Further recommendations after the above A total time of 45 minutes was spent preparing to see the patient, reviewing a myriad of data, obtaining comprehensive history, performing medically appropriate physical examination, counseling and educating the patient regarding the above listed issues, ordering medication, ordering endoscopy, ordering colonoscopy, and documenting clinical information in the health record

## 2023-07-21 DIAGNOSIS — M79674 Pain in right toe(s): Secondary | ICD-10-CM | POA: Diagnosis not present

## 2023-07-21 DIAGNOSIS — M79675 Pain in left toe(s): Secondary | ICD-10-CM | POA: Diagnosis not present

## 2023-07-22 ENCOUNTER — Encounter: Payer: Self-pay | Admitting: Internal Medicine

## 2023-08-06 ENCOUNTER — Other Ambulatory Visit: Payer: Self-pay | Admitting: Internal Medicine

## 2023-08-21 ENCOUNTER — Encounter: Payer: Self-pay | Admitting: Internal Medicine

## 2023-08-21 ENCOUNTER — Ambulatory Visit: Admitting: Internal Medicine

## 2023-08-21 VITALS — BP 127/70 | HR 46 | Temp 98.1°F | Resp 19 | Ht 70.0 in | Wt 234.0 lb

## 2023-08-21 DIAGNOSIS — K766 Portal hypertension: Secondary | ICD-10-CM | POA: Diagnosis not present

## 2023-08-21 DIAGNOSIS — K3189 Other diseases of stomach and duodenum: Secondary | ICD-10-CM

## 2023-08-21 DIAGNOSIS — R1013 Epigastric pain: Secondary | ICD-10-CM

## 2023-08-21 DIAGNOSIS — K253 Acute gastric ulcer without hemorrhage or perforation: Secondary | ICD-10-CM

## 2023-08-21 DIAGNOSIS — K635 Polyp of colon: Secondary | ICD-10-CM | POA: Diagnosis not present

## 2023-08-21 DIAGNOSIS — K529 Noninfective gastroenteritis and colitis, unspecified: Secondary | ICD-10-CM | POA: Diagnosis not present

## 2023-08-21 DIAGNOSIS — K5730 Diverticulosis of large intestine without perforation or abscess without bleeding: Secondary | ICD-10-CM

## 2023-08-21 DIAGNOSIS — E785 Hyperlipidemia, unspecified: Secondary | ICD-10-CM | POA: Diagnosis not present

## 2023-08-21 DIAGNOSIS — D125 Benign neoplasm of sigmoid colon: Secondary | ICD-10-CM | POA: Diagnosis not present

## 2023-08-21 DIAGNOSIS — F909 Attention-deficit hyperactivity disorder, unspecified type: Secondary | ICD-10-CM | POA: Diagnosis not present

## 2023-08-21 DIAGNOSIS — R933 Abnormal findings on diagnostic imaging of other parts of digestive tract: Secondary | ICD-10-CM | POA: Diagnosis not present

## 2023-08-21 DIAGNOSIS — K259 Gastric ulcer, unspecified as acute or chronic, without hemorrhage or perforation: Secondary | ICD-10-CM

## 2023-08-21 DIAGNOSIS — R197 Diarrhea, unspecified: Secondary | ICD-10-CM

## 2023-08-21 DIAGNOSIS — K648 Other hemorrhoids: Secondary | ICD-10-CM

## 2023-08-21 DIAGNOSIS — Z1211 Encounter for screening for malignant neoplasm of colon: Secondary | ICD-10-CM

## 2023-08-21 DIAGNOSIS — K319 Disease of stomach and duodenum, unspecified: Secondary | ICD-10-CM | POA: Diagnosis not present

## 2023-08-21 DIAGNOSIS — I10 Essential (primary) hypertension: Secondary | ICD-10-CM | POA: Diagnosis not present

## 2023-08-21 DIAGNOSIS — R935 Abnormal findings on diagnostic imaging of other abdominal regions, including retroperitoneum: Secondary | ICD-10-CM

## 2023-08-21 DIAGNOSIS — F32A Depression, unspecified: Secondary | ICD-10-CM | POA: Diagnosis not present

## 2023-08-21 MED ORDER — PANTOPRAZOLE SODIUM 40 MG PO TBEC: 40.0000 mg | DELAYED_RELEASE_TABLET | Freq: Two times a day (BID) | ORAL | 3 refills | Status: AC

## 2023-08-21 MED ORDER — SODIUM CHLORIDE 0.9 % IV SOLN: 500.0000 mL | Freq: Once | INTRAVENOUS | Status: DC

## 2023-08-21 NOTE — Patient Instructions (Addendum)
 -Handout on polyp, diverticulosis, gastric ulcer  provided. -await pathology results. -repeat colonoscopy for surveillance recommended. Date to be determined when pathology result become available.  -Continue present medications. Start taking Pantoprazole  40 mg twice a day until follow up EGD 10/23/2023 at 1000. You will received a phone call on 10/08/2023 at 8;30 to go over your instructions for the procedure - No aspirin, ibuprofen, naproxen, or other non-steroidal anti-inflammatory drugs.  -Restrict Alcohol consumption.   YOU HAD AN ENDOSCOPIC PROCEDURE TODAY AT THE [facility]:   Refer to the procedure report that was given to you for any specific questions about what was found during the examination.  If the procedure report does not answer your questions, please call your gastroenterologist to clarify.  If you requested that your care partner not be given the details of your procedure findings, then the procedure report has been included in a sealed envelope for you to review at your convenience later.  YOU SHOULD EXPECT: Some feelings of bloating in the abdomen. Passage of more gas than usual.  Walking can help get rid of the air that was put into your GI tract during the procedure and reduce the bloating. If you had a lower endoscopy (such as a colonoscopy or flexible sigmoidoscopy) you may notice spotting of blood in your stool or on the toilet paper. If you underwent a bowel prep for your procedure, you may not have a normal bowel movement for a few days.  Please Note:  You might notice some irritation and congestion in your nose or some drainage.  This is from the oxygen used during your procedure.  There is no need for concern and it should clear up in a day or so.  SYMPTOMS TO REPORT IMMEDIATELY:  Following lower endoscopy (colonoscopy or flexible sigmoidoscopy):  Excessive amounts of blood in the stool  Significant tenderness or worsening of abdominal pains  Swelling of  the abdomen that is new, acute  Fever of 100F or higher  Following upper endoscopy (EGD)  Vomiting of blood or coffee ground material  New chest pain or pain under the shoulder blades  Painful or persistently difficult swallowing  New shortness of breath  Fever of 100F or higher  Black, tarry-looking stools  For urgent or emergent issues, a gastroenterologist can be reached at any hour by calling [phone]. Do not use MyChart messaging for urgent concerns.    DIET:  We do recommend a small meal at first, but then you may proceed to your regular diet.  Drink plenty of fluids but you should avoid alcoholic beverages for 24 hours.  ACTIVITY:  You should plan to take it easy for the rest of today and you should NOT DRIVE or use heavy machinery until tomorrow (because of the sedation medicines used during the test).    FOLLOW UP: Our staff will call the number listed on your records the next business day following your procedure.  We will call around 7:15- 8:00 am to check on you and address any questions or concerns that you may have regarding the information given to you following your procedure. If we do not reach you, we will leave a message.     If any biopsies were taken you will be contacted by phone or by letter within the next 1-3 weeks.  Please call us  at [phone] if you have not heard about the biopsies in 3 weeks.    SIGNATURES/CONFIDENTIALITY: You and/or your care partner have signed paperwork which  will be entered into your electronic medical record.  These signatures attest to the fact that that the information above on your After Visit Summary has been reviewed and is understood.  Full responsibility of the confidentiality of this discharge information lies with you and/or your care-partner. Peptic Ulcer  A peptic ulcer is a painful sore in the lining of your stomach or the first part of your small intestine. What are the causes? Common causes of this condition  include: An infection. Using certain pain medicines too often or too much. Rare tumors in the stomach, small intestine, or pancreas. What increases the risk? You are more likely to get this condition if you: Smoke. Have a family history of ulcer disease. Drink alcohol. Have been hospitalized in an intensive care unit (ICU). What are the signs or symptoms? Symptoms include: Burning pain in the area between the chest and the belly button. The pain may: Not go away (be persistent). Be worse when your stomach is empty. Be worse at night. Heartburn. Feeling sick to your stomach (nauseous) and throwing up (vomiting). Bloating. If the ulcer results in bleeding, it can cause you to: Have poop (stool) that is black and looks like tar. Throw up bright red blood. Throw up material that looks like coffee grounds. How is this treated? Treatment for this condition may include: Stopping things that can cause the ulcer, such as: Smoking. Using pain medicines. Drinking alcohol or caffeine. Medicines to reduce stomach acid. Antibiotic medicines if the ulcer is caused by an infection. A procedure that is done using a small, flexible tube that has a camera at the end (upper endoscopy). This may be done if you have a bleeding ulcer. Surgery. This may be needed if: You have a lot of bleeding. The ulcer caused a hole somewhere in the digestive system. Follow these instructions at home: Do not drink alcohol if your doctor tells you not to drink. Limit how much caffeine you take in. Do not smoke or use any products that contain nicotine or tobacco. If you need help quitting, ask your doctor. Take over-the-counter and prescription medicines only as told by your doctor. Do not stop or change your medicines unless you talk with your doctor about it first. Do not take aspirin, ibuprofen, or other NSAIDs unless your doctor told you to do so. Keep all follow-up visits. Contact a doctor if: You do not  get better in 7 days after you start treatment. You keep having an upset stomach (indigestion) or heartburn. Get help right away if: You have sudden, sharp pain in your belly (abdomen). You have belly pain that does not go away. You have bloody poop (stool) or black, tarry poop. You throw up blood. It may look like coffee grounds. You feel light-headed or feel like you may pass out (faint). You get weak. You get sweaty or feel sticky and cold to the touch (clammy). These symptoms may be an emergency. Get help right away. Call 911. Do not wait to see if the symptoms will go away. Do not drive yourself to the hospital. Summary Symptoms of a peptic ulcer include burning pain in the area between the chest and the belly button. Do not smoke or use any products that contain nicotine or tobacco. If you need help quitting, ask your doctor. Take medicines only as told by your doctor. Limit how much alcohol and caffeine you have. Keep all follow-up visits. This information is not intended to replace advice given to you by your  health care provider. Make sure you discuss any questions you have with your health care provider. Document Revised: 08/17/2020 Document Reviewed: 08/18/2020 Elsevier Patient Education  2024 [employer].

## 2023-08-21 NOTE — Op Note (Signed)
 [facility] Patient Name: Andrew Woodard Procedure Date: 08/21/2023 2:13 PM MRN: [id number] Endoscopist: Norleen SAILOR. Abran , MD, [id number] Age: 73 Referring MD:  Date of Birth: September 14, 1950 Gender: Male Account #: [id number] Procedure:                Upper GI endoscopy with biopsies Indications:              Epigastric abdominal pain, Abnormal CT of the GI                            tract Medicines:                Monitored Anesthesia Care Procedure:                Pre-Anesthesia Assessment:                           - Prior to the procedure, a History and Physical                            was performed, and patient medications and                            allergies were reviewed. The patient's tolerance of                            previous anesthesia was also reviewed. The risks                            and benefits of the procedure and the sedation                            options and risks were discussed with the patient.                            All questions were answered, and informed consent                            was obtained. Prior Anticoagulants: The patient has                            taken no anticoagulant or antiplatelet agents. ASA                            Grade Assessment: II - A patient with mild systemic                            disease. After reviewing the risks and benefits,                            the patient was deemed in satisfactory condition to                            undergo the procedure.  After obtaining informed consent, the endoscope was                            passed under direct vision. Throughout the                            procedure, the patient's blood pressure, pulse, and                            oxygen saturations were monitored continuously. The                            Olympus Scope [id number] was introduced through the                            mouth, and advanced to the second part  of duodenum.                            The upper GI endoscopy was accomplished without                            difficulty. The patient tolerated the procedure                            well. Scope In: Scope Out: Findings:                 The esophagus was normal. No inflammation or                            varices.                           The stomach revealed mucosal changes suggesting                            portal hypertensive gastropathy. The gastric antrum                            revealed heaped up mucosa with surrounding erythema                            and central ulceration. Multiple biopsies were                            taken with a cold forceps for histology.                           The examined duodenum revealed edematous                            erythematous prominent folds. See images.                           The cardia and gastric fundus were normal on  retroflexion, save small hiatal hernia. Complications:            No immediate complications. Estimated Blood Loss:     Estimated blood loss: none. Impression:               1. Normal esophagus.                           2. Heaped up ulcerated gastric mucosa status post                            biopsies                           3. Question portal hypertensive gastropathy                           4. Edematous duodenal folds. Recommendation:           1. Patient has a contact number available for                            emergencies. The signs and symptoms of potential                            delayed complications were discussed with the                            patient. Return to normal activities tomorrow.                            Written discharge instructions were provided to the                            patient.                           2. Resume previous diet.                           3. Continue present medications.                           4.  Prescribe pantoprazole  40 mg p.o. twice daily;                            #60; 3 refills. PLEASE TAKE THIS MEDICATION TWICE                            EVERY DAY UNTIL FOLLOW-UP                           5. Avoid NSAIDs (medicines like Advil, Aleve,                            ibuprofen etc.).  6. Restrict alcohol consumption                           7. Follow-up biopsies                           8. Repeat upper endoscopy in 8 weeks to ensure                            mucosal healing Norleen SAILOR. Abran, MD 08/21/2023 3:08:23 PM This report has been signed electronically.

## 2023-08-21 NOTE — Progress Notes (Signed)
 Called to room to assist during endoscopic procedure.  Patient ID and intended procedure confirmed with present staff. Received instructions for my participation in the procedure from the performing physician.

## 2023-08-21 NOTE — Progress Notes (Signed)
 Vss nad trans to pacu

## 2023-08-21 NOTE — Progress Notes (Signed)
 Expand All Collapse All HISTORY OF PRESENT ILLNESS:   Andrew Woodard is a 73 y.o. male, retired Control and instrumentation engineer and prior acquaintance of the Andrew Woodard, who presents today regarding abdominal pain and abnormal CT scan.   The patient was last seen in this office January 23, 2018 for regarding chronic diarrhea with urgency and colon cancer screening.  Celiac testing was negative.  He was scheduled for colonoscopy but canceled and did not reschedule.   For his diarrhea, he tells me that he takes Imodium as needed.  This helps.  Without Imodium, issues with diarrhea and urgency.   Next, he tells me that he developed severe upper abdominal pain which brought him to the emergency room Jun 13, 2023.  Reviewed.  At that time he reported drinking a bottle of wine per day as well as meloxicam  daily for years.  He was told to discontinue NSAIDs.  A CT scan of the abdomen and pelvis was performed that day.  He was noted to have asymmetric thickening of the gastric antrum and bulb with short segment intussusception of the duodenal bulb/proximal duodenum.  Concerns were infectious, inflammatory, or neoplastic.  Endoscopy recommended.   Patient tells me that he has been taking sucralfate  which was prescribed as well as pantoprazole  somewhat sporadically.  Still with some upper abdominal discomfort, though less.  He did vomit 1 time on presentation.  None since.  Denies weight loss.  He is concerned.  Taking Tylenol  for pain.   Blood work from Jun 13, 2023 shows mildly elevated lipase of 90.  Calcium  10.5.  Other electrolytes unremarkable.  Normal liver test.  CBC revealed white blood cell count of 10.3.  Hemoglobin 13.8.   Last colonoscopy May 2014 revealed diverticulosis.  Otherwise normal.  Prior colonoscopy in 2006 was also negative for neoplasia   REVIEW OF SYSTEMS:   All non-GI ROS negative unless otherwise stated in the HPI except for anxiety, arthritis, back pain, depression       Past Medical  History:  Diagnosis Date   ADD (attention deficit disorder)     Allergy     Arthritis     Colon polyps     Depression     Fasting hyperglycemia     Hyperlipidemia     Hypertension     Pneumonia                 Past Surgical History:  Procedure Laterality Date   ABSCESS DRAINAGE   2012    L thigh, Dr Andrew Woodard (not MRSA)   COLONOSCOPY   2014   colonoscopy with polypectomy   2007    Dr Andrew Woodard   CYSTECTOMY   2011    L knee, Dr Andrew Woodard   [facility]   2013     Dr Andrew Woodard   ganglionectomy       PILONIDAL CYST EXCISION        X 2   VASECTOMY        X 2; Dr Andrew Woodard          Social History Andrew Woodard  reports that he has been smoking cigarettes. He has never used smokeless tobacco. He reports current alcohol use. He reports that he does not use drugs.   family history includes Alzheimer's disease in his mother; Colon cancer (age of onset: 9) in his maternal grandmother; Depression in his father; Heart attack (age of onset: 56) in his maternal grandfather and paternal grandfather; Kidney disease in his father.   Allergies  Allergies  Allergen Reactions   Penicillins        Throat swelled   Wellbutrin [Bupropion]        Felt bad on it            PHYSICAL EXAMINATION: Vital signs: BP 118/70   Pulse 70   Ht 5' 10 (1.778 m)   Wt 234 lb (106.1 kg)   SpO2 97%   BMI 33.58 kg/m   Constitutional: generally well-appearing, no acute distress Psychiatric: alert and oriented x3, cooperative Eyes: extraocular movements intact, anicteric, conjunctiva pink Mouth: oral pharynx moist, no lesions Neck: supple no lymphadenopathy Cardiovascular: heart regular rate and rhythm, no murmur Lungs: clear to auscultation bilaterally Abdomen: soft, nontender, nondistended, no obvious ascites, no peritoneal signs, normal bowel sounds, no organomegaly Rectal: Deferred until colonoscopy Extremities: no clubbing, cyanosis, or lower extremity edema bilaterally Skin: no lesions on visible  extremities Neuro: No focal deficits. No asterixis.       ASSESSMENT:   1.  Upper abdominal pain 2.  Abnormal CT scan with asymmetric thickening of the gastric antrum and duodenal bulb. 3.  Chronic alcohol use 4.  Mildly elevated lipase 5.  Chronic diarrhea 6.  Colon cancer screening     PLAN:   1.  Prescribe pantoprazole  40 mg daily. 2.  Medication risk reviewed 3.  Curb alcohol 4.  Continue to avoid NSAIDs 5.  Schedule upper endoscopy to evaluate abdominal pain and abnormal CT.The nature of the procedure, as well as the risks, benefits, and alternatives were carefully and thoroughly reviewed with the patient. Ample time for discussion and questions allowed. The patient understood, was satisfied, and agreed to proceed. 6.  Schedule colonoscopy for colon cancer screening and to evaluate chronic diarrhea.The nature of the procedure, as well as the risks, benefits, and alternatives were carefully and thoroughly reviewed with the patient. Ample time for discussion and questions allowed. The patient understood, was satisfied, and agreed to proceed. 7.  Further recommendations after the above

## 2023-08-21 NOTE — Op Note (Signed)
 [facility] Patient Name: Andrew Woodard Procedure Date: 08/21/2023 2:14 PM MRN: [id number] Endoscopist: Norleen SAILOR. Abran , MD, [id number] Age: 73 Referring MD:  Date of Birth: [birth year] Gender: Male Account #: [id number] Procedure:                Colonoscopy with cold snare polypectomy x 1; random                            biopsies Indications:              Screening for colorectal malignant neoplasm.                            Previous examinations 2006 and 2014 were negative                            for neoplasia. Due for follow-up screening. Chronic                            diarrhea. Medicines:                Monitored Anesthesia Care Procedure:                Pre-Anesthesia Assessment:                           - Prior to the procedure, a History and Physical                            was performed, and patient medications and                            allergies were reviewed. The patient's tolerance of                            previous anesthesia was also reviewed. The risks                            and benefits of the procedure and the sedation                            options and risks were discussed with the patient.                            All questions were answered, and informed consent                            was obtained. Prior Anticoagulants: The patient has                            taken no anticoagulant or antiplatelet agents. ASA                            Grade Assessment: II - A patient with mild systemic  disease. After reviewing the risks and benefits,                            the patient was deemed in satisfactory condition to                            undergo the procedure.                           After obtaining informed consent, the colonoscope                            was passed under direct vision. Throughout the                            procedure, the patient's blood pressure, pulse, and                             oxygen saturations were monitored continuously. The                            CF [id number] #[id number] was introduced through the anus                            and advanced to the the cecum, identified by                            appendiceal orifice and ileocecal valve. The                            ileocecal valve, appendiceal orifice, and rectum                            were photographed. The quality of the bowel                            preparation was good. The colonoscopy was performed                            without difficulty. The patient tolerated the                            procedure well. The bowel preparation used was                            SUPREP via split dose instruction. Scope In: 2:29:30 PM Scope Out: 2:44:02 PM Scope Withdrawal Time: 0 hours 11 minutes 23 seconds  Total Procedure Duration: 0 hours 14 minutes 32 seconds  Findings:                 A 4 mm polyp was found in the sigmoid colon. The                            polyp was removed with a cold snare. Resection  and                            retrieval were complete.                           Multiple diverticula were found in the left colon                            and right colon.                           Internal hemorrhoids were found during retroflexion.                           The exam was otherwise without abnormality on                            direct and retroflexion views. Complications:            No immediate complications. Estimated blood loss:                            None. Estimated Blood Loss:     Estimated blood loss: none. Impression:               - One 4 mm polyp in the sigmoid colon, removed with                            a cold snare. Resected and retrieved.                           - Diverticulosis in the left colon and in the right                            colon.                           - Internal hemorrhoids.                           - The  examination was otherwise normal on direct                            and retroflexion views. Recommendation:           - Repeat colonoscopy in 7-10 years for surveillance.                           - Patient has a contact number available for                            emergencies. The signs and symptoms of potential                            delayed complications were discussed with the  patient. Return to normal activities tomorrow.                            Written discharge instructions were provided to the                            patient.                           - Resume previous diet.                           - Continue present medications.                           - Await pathology results. Norleen SAILOR. Abran, MD 08/21/2023 2:50:45 PM This report has been signed electronically.

## 2023-08-22 ENCOUNTER — Telehealth: Payer: Self-pay | Admitting: *Deleted

## 2023-08-22 LAB — SURGICAL PATHOLOGY

## 2023-08-22 NOTE — Telephone Encounter (Signed)
  Follow up Call-     08/21/2023   12:58 PM  Call back number  Post procedure Call Back phone  # [phone]  Permission to leave phone message Yes     Patient questions:  Do you have a fever, pain , or abdominal swelling? No. Pain Score  0 *  Have you tolerated food without any problems? Yes.    Have you been able to return to your normal activities? Yes.    Do you have any questions about your discharge instructions: Diet   No. Medications  No. Follow up visit  No.  Do you have questions or concerns about your Care? No.  Actions: * If pain score is 4 or above: No action needed, pain <4.

## 2023-08-23 ENCOUNTER — Ambulatory Visit: Payer: Self-pay | Admitting: Internal Medicine

## 2023-09-11 ENCOUNTER — Encounter: Payer: Self-pay | Admitting: Internal Medicine

## 2023-09-11 NOTE — Progress Notes (Unsigned)
 Subjective:    Patient ID: Andrew Woodard, male    DOB: 1950/06/11, 73 y.o.   MRN: [id number]      HPI Izea is here for No chief complaint on file.        Medications and allergies reviewed with patient and updated if appropriate.  Current Outpatient Medications on File Prior to Visit  Medication Sig Dispense Refill   Evolocumab  (REPATHA  SURECLICK) 140 MG/ML SOAJ INJECT 140MG  SUBCUTANEOUSLY EVERY  14  DAYS 6 mL 3   lisinopril  (ZESTRIL ) 20 MG tablet Take 1 tablet by mouth once daily 90 tablet 0   metoprolol  succinate (TOPROL -XL) 50 MG 24 hr tablet Take 1 tablet (50 mg total) by mouth daily. Take with or immediately following a meal. 90 tablet 1   ondansetron  (ZOFRAN -ODT) 4 MG disintegrating tablet Take 1 tablet (4 mg total) by mouth every 8 (eight) hours as needed for nausea or vomiting. 20 tablet 0   pantoprazole  (PROTONIX ) 40 MG tablet Take 1 tablet (40 mg total) by mouth 2 (two) times daily. Please take this medication twice daily until follow up 60 tablet 3   rosuvastatin  (CRESTOR ) 10 MG tablet Take 1 tablet (10 mg total) by mouth daily. 90 tablet 2   sertraline  (ZOLOFT ) 100 MG tablet Take 1 tablet by mouth once daily 90 tablet 0   sucralfate  (CARAFATE ) 1 g tablet Take 1 tablet (1 g total) by mouth 4 (four) times daily -  with meals and at bedtime. (Patient not taking: Reported on 08/21/2023) 90 tablet 0   tadalafil  (CIALIS ) 20 MG tablet Take 0.5-1 tablets (10-20 mg total) by mouth every other day as needed for erectile dysfunction. 10 tablet 11   Current Facility-Administered Medications on File Prior to Visit  Medication Dose Route Frequency Provider Last Rate Last Admin   0.9 %  sodium chloride  infusion  500 mL Intravenous Once Abran Norleen SAILOR, MD        Review of Systems     Objective:  There were no vitals filed for this visit. BP Readings from Last 3 Encounters:  08/21/23 127/70  07/15/23 118/70  06/13/23 (!) 159/98   Wt Readings from Last 3 Encounters:   08/21/23 234 lb (106.1 kg)  07/15/23 234 lb (106.1 kg)  06/03/23 242 lb (109.8 kg)   There is no height or weight on file to calculate BMI.    Physical Exam Constitutional:      General: He is not in acute distress.    Appearance: Normal appearance. He is not ill-appearing.  HENT:     Head: Normocephalic.     Right Ear: Tympanic membrane, ear canal and external ear normal. There is no impacted cerumen.     Left Ear: Tympanic membrane, ear canal and external ear normal. There is no impacted cerumen.     Mouth/Throat:     Mouth: Mucous membranes are moist.     Pharynx: No oropharyngeal exudate or posterior oropharyngeal erythema.  Eyes:     Conjunctiva/sclera: Conjunctivae normal.  Cardiovascular:     Rate and Rhythm: Normal rate and regular rhythm.  Pulmonary:     Effort: Pulmonary effort is normal. No respiratory distress.     Breath sounds: Normal breath sounds. No wheezing or rales.  Musculoskeletal:     Cervical back: Neck supple. No tenderness.  Lymphadenopathy:     Cervical: No cervical adenopathy.  Skin:    General: Skin is warm and dry.     Findings: No rash.  Neurological:  Mental Status: He is alert.            Assessment & Plan:    See Problem List for Assessment and Plan of chronic medical problems.

## 2023-09-12 ENCOUNTER — Ambulatory Visit (INDEPENDENT_AMBULATORY_CARE_PROVIDER_SITE_OTHER): Admitting: Internal Medicine

## 2023-09-12 ENCOUNTER — Other Ambulatory Visit: Payer: Self-pay | Admitting: Internal Medicine

## 2023-09-12 VITALS — BP 130/70 | HR 53 | Temp 98.3°F | Ht 70.0 in | Wt 237.0 lb

## 2023-09-12 DIAGNOSIS — I10 Essential (primary) hypertension: Secondary | ICD-10-CM | POA: Diagnosis not present

## 2023-09-12 DIAGNOSIS — H60393 Other infective otitis externa, bilateral: Secondary | ICD-10-CM

## 2023-09-12 DIAGNOSIS — H6993 Unspecified Eustachian tube disorder, bilateral: Secondary | ICD-10-CM

## 2023-09-12 DIAGNOSIS — J0100 Acute maxillary sinusitis, unspecified: Secondary | ICD-10-CM | POA: Diagnosis not present

## 2023-09-12 DIAGNOSIS — H6090 Unspecified otitis externa, unspecified ear: Secondary | ICD-10-CM | POA: Insufficient documentation

## 2023-09-12 MED ORDER — SULFAMETHOXAZOLE-TRIMETHOPRIM 800-160 MG PO TABS: 1.0000 | ORAL_TABLET | Freq: Two times a day (BID) | ORAL | 0 refills | Status: AC

## 2023-09-12 MED ORDER — PREDNISONE 20 MG PO TABS
40.0000 mg | ORAL_TABLET | Freq: Every day | ORAL | 0 refills | Status: AC
Start: 2023-09-12 — End: 2023-09-17

## 2023-09-12 MED ORDER — CIPRO HC 0.2-1 % OT SUSP: 3.0000 [drp] | Freq: Two times a day (BID) | OTIC | 0 refills | Status: DC

## 2023-09-12 NOTE — Assessment & Plan Note (Addendum)
 Acute Likely bacterial With associated eustachian tube dysfunction and otitis externa Start Bactrim  DS BID x 10 day otc cold medications Rest, fluid Call if no improvement

## 2023-09-12 NOTE — Assessment & Plan Note (Signed)
 Acute Likely related to sinus infection Will start Bactrim  DS twice daily x 10 days Given the degree of dysfunction will also start prednisone  40 mg daily x 5 days

## 2023-09-12 NOTE — Assessment & Plan Note (Signed)
Chronic Blood pressure well controlled Continue lisinopril 20 mg daily, metoprolol XL 50 mg daily 

## 2023-09-12 NOTE — Patient Instructions (Addendum)
        Medications changes include :   prednisone , bactrim , ear drops    Return if symptoms worsen or fail to improve.

## 2023-09-12 NOTE — Assessment & Plan Note (Signed)
 Acute Bilateral ears with swelling, discharge and erythema Start Cipro  HC otic drops

## 2023-10-08 ENCOUNTER — Encounter: Payer: Self-pay | Admitting: Internal Medicine

## 2023-10-08 ENCOUNTER — Ambulatory Visit (AMBULATORY_SURGERY_CENTER)

## 2023-10-08 VITALS — Ht 70.0 in | Wt 234.0 lb

## 2023-10-08 DIAGNOSIS — R1013 Epigastric pain: Secondary | ICD-10-CM

## 2023-10-08 NOTE — Progress Notes (Signed)
 No egg or soy allergy known to patient  No issues known to pt with past sedation with any surgeries or procedures Patient denies ever being told they had issues or difficulty with intubation  No FH of Malignant Hyperthermia Pt is not on diet pills Pt is not on  home 02  Pt is not on blood thinners  Pt denies issues with constipation  No A fib or A flutter Have any cardiac testing pending--No Pt can ambulate  Pt denies use of chewing tobacco Discussed diabetic I weight loss medication holds Discussed NSAID holds Checked BMI Pt instructed to use Single[employer] or GoodRx for a price reduction on prep  Patient's chart reviewed.  Pre visit completed

## 2023-10-23 ENCOUNTER — Encounter: Payer: Self-pay | Admitting: Internal Medicine

## 2023-10-23 ENCOUNTER — Ambulatory Visit (AMBULATORY_SURGERY_CENTER): Admitting: Internal Medicine

## 2023-10-23 VITALS — BP 121/58 | HR 48 | Temp 98.4°F | Resp 18 | Ht 70.0 in | Wt 234.0 lb

## 2023-10-23 DIAGNOSIS — R1013 Epigastric pain: Secondary | ICD-10-CM

## 2023-10-23 DIAGNOSIS — K3189 Other diseases of stomach and duodenum: Secondary | ICD-10-CM | POA: Diagnosis not present

## 2023-10-23 DIAGNOSIS — E785 Hyperlipidemia, unspecified: Secondary | ICD-10-CM | POA: Diagnosis not present

## 2023-10-23 DIAGNOSIS — K253 Acute gastric ulcer without hemorrhage or perforation: Secondary | ICD-10-CM | POA: Diagnosis not present

## 2023-10-23 DIAGNOSIS — F32A Depression, unspecified: Secondary | ICD-10-CM | POA: Diagnosis not present

## 2023-10-23 DIAGNOSIS — F909 Attention-deficit hyperactivity disorder, unspecified type: Secondary | ICD-10-CM | POA: Diagnosis not present

## 2023-10-23 DIAGNOSIS — R935 Abnormal findings on diagnostic imaging of other abdominal regions, including retroperitoneum: Secondary | ICD-10-CM

## 2023-10-23 DIAGNOSIS — R933 Abnormal findings on diagnostic imaging of other parts of digestive tract: Secondary | ICD-10-CM | POA: Diagnosis not present

## 2023-10-23 DIAGNOSIS — I10 Essential (primary) hypertension: Secondary | ICD-10-CM | POA: Diagnosis not present

## 2023-10-23 MED ORDER — SODIUM CHLORIDE 0.9 % IV SOLN: 500.0000 mL | Freq: Once | INTRAVENOUS | Status: DC

## 2023-10-23 NOTE — Op Note (Signed)
 [name] Procedure Date: 10/23/2023 11:15 AM MRN: [id number] Endoscopist: Norleen SAILOR. Abran , MD, [id number] Age: 73 Referring MD:  Date of Birth: August 30, 1950 Gender: Male Account #: [id number] Procedure:                Upper GI endoscopy with biopsies Indications:              Epigastric abdominal pain, Follow-up of acute                            gastric ulcer. Previous EGD August 21, 2023 Medicines:                Monitored Anesthesia Care Procedure:                Pre-Anesthesia Assessment:                           - Prior to the procedure, a History and Physical                            was performed, and patient medications and                            allergies were reviewed. The patient's tolerance of                            previous anesthesia was also reviewed. The risks                            and benefits of the procedure and the sedation                            options and risks were discussed with the patient.                            All questions were answered, and informed consent                            was obtained. Prior Anticoagulants: The patient has                            taken no anticoagulant or antiplatelet agents. ASA                            Grade Assessment: II - A patient with mild systemic                            disease. After reviewing the risks and benefits,                            the patient was deemed in satisfactory condition to                            undergo the procedure.  After obtaining informed consent, the endoscope was                            passed under direct vision. Throughout the                            procedure, the patient's blood pressure, pulse, and                            oxygen saturations were monitored continuously. The                            Olympus Scope [id number] was introduced through the                             mouth, and advanced to the second part of duodenum.                            The upper GI endoscopy was accomplished without                            difficulty. The patient tolerated the procedure                            well. Scope In: Scope Out: Findings:                 The esophagus was normal.                           The stomach revealed marked improvement in                            ulcerated and heaped up areas in the antrum. There                            was 1 area with some fullness and erythema without                            gross ulceration. Biopsies were taken of this area                            with a cold forceps for histology.                           The examined duodenum was normal.                           The cardia and gastric fundus were normal on                            retroflexion. Complications:            No immediate complications. Estimated Blood Loss:     Estimated blood loss: none. Impression:               -  Normal esophagus.                           - Normal stomach. Biopsied.                           - Normal examined duodenum. Recommendation:           - Patient has a contact number available for                            emergencies. The signs and symptoms of potential                            delayed complications were discussed with the                            patient. Return to normal activities tomorrow.                            Written discharge instructions were provided to the                            patient.                           - Resume previous diet.                           - Continue present medications.                           - Continue pantoprazole  40 mg daily                           - Follow-up biopsies                           - Avoid NSAIDs (medicines like Advil and ibuprofen)                           - Minimize alcohol consumption Blimy Napoleon N. Abran, MD 10/23/2023 11:30:35 AM This  report has been signed electronically.

## 2023-10-23 NOTE — Progress Notes (Signed)
 Called to room to assist during endoscopic procedure.  Patient ID and intended procedure confirmed with present staff. Received instructions for my participation in the procedure from the performing physician.

## 2023-10-23 NOTE — Patient Instructions (Signed)
-  Await pathology results -Continue pantoprazole  40 mg daily -Avoid NSAIDs (advil, ibuprofen) -Minimize alcohol consumption  YOU HAD AN ENDOSCOPIC PROCEDURE TODAY AT THE [facility]:   Refer to the procedure report that was given to you for any specific questions about what was found during the examination.  If the procedure report does not answer your questions, please call your gastroenterologist to clarify.  If you requested that your care partner not be given the details of your procedure findings, then the procedure report has been included in a sealed envelope for you to review at your convenience later.  YOU SHOULD EXPECT: Some feelings of bloating in the abdomen. Passage of more gas than usual.  Walking can help get rid of the air that was put into your GI tract during the procedure and reduce the bloating. If you had a lower endoscopy (such as a colonoscopy or flexible sigmoidoscopy) you may notice spotting of blood in your stool or on the toilet paper. If you underwent a bowel prep for your procedure, you may not have a normal bowel movement for a few days.  Please Note:  You might notice some irritation and congestion in your nose or some drainage.  This is from the oxygen used during your procedure.  There is no need for concern and it should clear up in a day or so.  SYMPTOMS TO REPORT IMMEDIATELY:  Following upper endoscopy (EGD)  Vomiting of blood or coffee ground material  New chest pain or pain under the shoulder blades  Painful or persistently difficult swallowing  New shortness of breath  Fever of 100F or higher  Black, tarry-looking stools  For urgent or emergent issues, a gastroenterologist can be reached at any hour by calling [phone]. Do not use MyChart messaging for urgent concerns.    DIET:  We do recommend a small meal at first, but then you may proceed to your regular diet.  Drink plenty of fluids but you should avoid alcoholic beverages for 24  hours.  ACTIVITY:  You should plan to take it easy for the rest of today and you should NOT DRIVE or use heavy machinery until tomorrow (because of the sedation medicines used during the test).    FOLLOW UP: Our staff will call the number listed on your records the next business day following your procedure.  We will call around 7:15- 8:00 am to check on you and address any questions or concerns that you may have regarding the information given to you following your procedure. If we do not reach you, we will leave a message.     If any biopsies were taken you will be contacted by phone or by letter within the next 1-3 weeks.  Please call us  at [phone] if you have not heard about the biopsies in 3 weeks.    SIGNATURES/CONFIDENTIALITY: You and/or your care partner have signed paperwork which will be entered into your electronic medical record.  These signatures attest to the fact that that the information above on your After Visit Summary has been reviewed and is understood.  Full responsibility of the confidentiality of this discharge information lies with you and/or your care-partner.

## 2023-10-23 NOTE — Progress Notes (Signed)
 Vss nad trans to pacu

## 2023-10-23 NOTE — Progress Notes (Signed)
 Expand All Collapse All HISTORY OF PRESENT ILLNESS:   Andrew Woodard is a 73 y.o. male, retired Control and instrumentation engineer and prior acquaintance of the Garrel Degree, who presents today regarding abdominal pain and abnormal CT scan.   The patient was last seen in this office January 23, 2018 for regarding chronic diarrhea with urgency and colon cancer screening.  Celiac testing was negative.  He was scheduled for colonoscopy but canceled and did not reschedule.   For his diarrhea, he tells me that he takes Imodium as needed.  This helps.  Without Imodium, issues with diarrhea and urgency.   Next, he tells me that he developed severe upper abdominal pain which brought him to the emergency room Jun 13, 2023.  Reviewed.  At that time he reported drinking a bottle of wine per day as well as meloxicam  daily for years.  He was told to discontinue NSAIDs.  A CT scan of the abdomen and pelvis was performed that day.  He was noted to have asymmetric thickening of the gastric antrum and bulb with short segment intussusception of the duodenal bulb/proximal duodenum.  Concerns were infectious, inflammatory, or neoplastic.  Endoscopy recommended.   Patient tells me that he has been taking sucralfate  which was prescribed as well as pantoprazole  somewhat sporadically.  Still with some upper abdominal discomfort, though less.  He did vomit 1 time on presentation.  None since.  Denies weight loss.  He is concerned.  Taking Tylenol  for pain.   Blood work from Jun 13, 2023 shows mildly elevated lipase of 90.  Calcium  10.5.  Other electrolytes unremarkable.  Normal liver test.  CBC revealed white blood cell count of 10.3.  Hemoglobin 13.8.   Last colonoscopy May 2014 revealed diverticulosis.  Otherwise normal.  Prior colonoscopy in 2006 was also negative for neoplasia   REVIEW OF SYSTEMS:   All non-GI ROS negative unless otherwise stated in the HPI except for anxiety, arthritis, back pain, depression       Past Medical  History:  Diagnosis Date   ADD (attention deficit disorder)     Allergy     Arthritis     Colon polyps     Depression     Fasting hyperglycemia     Hyperlipidemia     Hypertension     Pneumonia                 Past Surgical History:  Procedure Laterality Date   ABSCESS DRAINAGE   2012    L thigh, Dr Mikell (not MRSA)   COLONOSCOPY   2014   colonoscopy with polypectomy   2007    Dr Abran   CYSTECTOMY   2011    L knee, Dr Shari   [facility]   2013     Dr Eldonna   ganglionectomy       PILONIDAL CYST EXCISION        X 2   VASECTOMY        X 2; Dr Ottelin          Social History [name]  reports that he has been smoking cigarettes. He has never used smokeless tobacco. He reports current alcohol use. He reports that he does not use drugs.   family history includes Alzheimer's disease in his mother; Colon cancer (age of onset: 64) in his maternal grandmother; Depression in his father; Heart attack (age of onset: 74) in his maternal grandfather and paternal grandfather; Kidney disease in his father.   Allergies  Allergies  Allergen Reactions   Penicillins        Throat swelled   Wellbutrin [Bupropion]        Felt bad on it            PHYSICAL EXAMINATION: Vital signs: BP 118/70   Pulse 70   Ht 5' 10 (1.778 m)   Wt 234 lb (106.1 kg)   SpO2 97%   BMI 33.58 kg/m   Constitutional: generally well-appearing, no acute distress Psychiatric: alert and oriented x3, cooperative Eyes: extraocular movements intact, anicteric, conjunctiva pink Mouth: oral pharynx moist, no lesions Neck: supple no lymphadenopathy Cardiovascular: heart regular rate and rhythm, no murmur Lungs: clear to auscultation bilaterally Abdomen: soft, nontender, nondistended, no obvious ascites, no peritoneal signs, normal bowel sounds, no organomegaly Rectal: Deferred until colonoscopy Extremities: no clubbing, cyanosis, or lower extremity edema bilaterally Skin: no lesions on visible  extremities Neuro: No focal deficits. No asterixis.       ASSESSMENT:   1.  Upper abdominal pain 2.  Abnormal CT scan with asymmetric thickening of the gastric antrum and duodenal bulb. 3.  Chronic alcohol use 4.  Mildly elevated lipase 5.  Chronic diarrhea 6.  Colon cancer screening     PLAN:   1.  Prescribe pantoprazole  40 mg daily. 2.  Medication risk reviewed 3.  Curb alcohol 4.  Continue to avoid NSAIDs 5.  Schedule upper endoscopy to evaluate abdominal pain and abnormal CT.The nature of the procedure, as well as the risks, benefits, and alternatives were carefully and thoroughly reviewed with the patient. Ample time for discussion and questions allowed. The patient understood, was satisfied, and agreed to proceed. 6.  Schedule colonoscopy for colon cancer screening and to evaluate chronic diarrhea.The nature of the procedure, as well as the risks, benefits, and alternatives were carefully and thoroughly reviewed with the patient. Ample time for discussion and questions allowed. The patient understood, was satisfied, and agreed to proceed. 7.  Further recommendations after the above       Office H&P as above.  The patient subsequently underwent colonoscopy and upper endoscopy as indicated above.  Upper endoscopy revealed heaped up antral mucosa with ulceration.  Biopsies were benign.  No H. pylori.  Placed on twice daily PPI.  Told to avoid NSAIDs and limit alcohol.  Now for follow-up endoscopy to reassess gastric mucosal abnormalities.

## 2023-10-24 ENCOUNTER — Telehealth: Payer: Self-pay | Admitting: *Deleted

## 2023-10-24 NOTE — Telephone Encounter (Signed)
  Follow up Call-     10/23/2023   10:27 AM 08/21/2023   12:58 PM  Call back number  Post procedure Call Back phone  # [phone]  Permission to leave phone message Yes Yes     Patient questions:  Do you have a fever, pain , or abdominal swelling? No. Pain Score  0 *  Have you tolerated food without any problems? Yes.    Have you been able to return to your normal activities? Yes.    Do you have any questions about your discharge instructions: Diet   No. Medications  No. Follow up visit  No.  Do you have questions or concerns about your Care? No.  Actions: * If pain score is 4 or above: No action needed, pain <4.

## 2023-10-28 ENCOUNTER — Ambulatory Visit: Payer: Self-pay | Admitting: Internal Medicine

## 2023-10-28 LAB — SURGICAL PATHOLOGY

## 2023-11-01 ENCOUNTER — Other Ambulatory Visit: Payer: Self-pay | Admitting: Internal Medicine

## 2023-12-04 ENCOUNTER — Encounter: Payer: Self-pay | Admitting: Internal Medicine

## 2023-12-04 NOTE — Patient Instructions (Addendum)
 Flu immunization administered today.     Blood work was ordered.       Medications changes include :   None     Return in about 6 months (around 06/03/2024) for follow up.   Health Maintenance, Male Adopting a healthy lifestyle and getting preventive care are important in promoting health and wellness. Ask your health care provider about: The right schedule for you to have regular tests and exams. Things you can do on your own to prevent diseases and keep yourself healthy. What should I know about diet, weight, and exercise? Eat a healthy diet  Eat a diet that includes plenty of vegetables, fruits, low-fat dairy products, and lean protein. Do not eat a lot of foods that are high in solid fats, added sugars, or sodium. Maintain a healthy weight Body mass index (BMI) is a measurement that can be used to identify possible weight problems. It estimates body fat based on height and weight. Your health care provider can help determine your BMI and help you achieve or maintain a healthy weight. Get regular exercise Get regular exercise. This is one of the most important things you can do for your health. Most adults should: Exercise for at least 150 minutes each week. The exercise should increase your heart rate and make you sweat (moderate-intensity exercise). Do strengthening exercises at least twice a week. This is in addition to the moderate-intensity exercise. Spend less time sitting. Even light physical activity can be beneficial. Watch cholesterol and blood lipids Have your blood tested for lipids and cholesterol at 71 years of age, then have this test every 5 years. You may need to have your cholesterol levels checked more often if: Your lipid or cholesterol levels are high. You are older than 71 years of age. You are at high risk for heart disease. What should I know about cancer screening? Many types of cancers can be detected early and may often be prevented. Depending on  your health history and family history, you may need to have cancer screening at various ages. This may include screening for: Colorectal cancer. Prostate cancer. Skin cancer. Lung cancer. What should I know about heart disease, diabetes, and high blood pressure? Blood pressure and heart disease High blood pressure causes heart disease and increases the risk of stroke. This is more likely to develop in people who have high blood pressure readings or are overweight. Talk with your health care provider about your target blood pressure readings. Have your blood pressure checked: Every 3-5 years if you are 36-65 years of age. Every year if you are 24 years old or older. If you are between the ages of 59 and 59 and are a current or former smoker, ask your health care provider if you should have a one-time screening for abdominal aortic aneurysm (AAA). Diabetes Have regular diabetes screenings. This checks your fasting blood sugar level. Have the screening done: Once every three years after age 50 if you are at a normal weight and have a low risk for diabetes. More often and at a younger age if you are overweight or have a high risk for diabetes. What should I know about preventing infection? Hepatitis B If you have a higher risk for hepatitis B, you should be screened for this virus. Talk with your health care provider to find out if you are at risk for hepatitis B infection. Hepatitis C Blood testing is recommended for: Everyone born from [birth year] through 1963. Anyone with known risk factors  for hepatitis C. Sexually transmitted infections (STIs) You should be screened each year for STIs, including gonorrhea and chlamydia, if: You are sexually active and are younger than 71 years of age. You are older than 71 years of age and your health care provider tells you that you are at risk for this type of infection. Your sexual activity has changed since you were last screened, and you are at increased  risk for chlamydia or gonorrhea. Ask your health care provider if you are at risk. Ask your health care provider about whether you are at high risk for HIV. Your health care provider may recommend a prescription medicine to help prevent HIV infection. If you choose to take medicine to prevent HIV, you should first get tested for HIV. You should then be tested every 3 months for as long as you are taking the medicine. Follow these instructions at home: Alcohol use Do not drink alcohol if your health care provider tells you not to drink. If you drink alcohol: Limit how much you have to 0-2 drinks a day. Know how much alcohol is in your drink. In the [place] one drink equals one 12 oz bottle of beer (355 mL), one 5 oz glass of wine (148 mL), or one 1 oz glass of hard liquor (44 mL). Lifestyle Do not use any products that contain nicotine or tobacco. These products include cigarettes, chewing tobacco, and vaping devices, such as e-cigarettes. If you need help quitting, ask your health care provider. Do not use street drugs. Do not share needles. Ask your health care provider for help if you need support or information about quitting drugs. General instructions Schedule regular health, dental, and eye exams. Stay current with your vaccines. Tell your health care provider if: You often feel depressed. You have ever been abused or do not feel safe at home. Summary Adopting a healthy lifestyle and getting preventive care are important in promoting health and wellness. Follow your health care provider's instructions about healthy diet, exercising, and getting tested or screened for diseases. Follow your health care provider's instructions on monitoring your cholesterol and blood pressure. This information is not intended to replace advice given to you by your health care provider. Make sure you discuss any questions you have with your health care provider. Document Revised: 05/29/2020 Document  Reviewed: 05/29/2020 Elsevier Patient Education  2024 [employer].

## 2023-12-04 NOTE — Progress Notes (Unsigned)
 Subjective:    Patient ID: Andrew Woodard, male    DOB: 1950/07/22, 73 y.o.   MRN: [id number]     HPI Andrew Woodard is here for a physical exam and his chronic medical problems.   Overall doing well.  Has no concerns.  He just spends a long weekend with one of his friends at the beach which is very restorative.   Medications and allergies reviewed with patient and updated if appropriate.  Current Outpatient Medications on File Prior to Visit  Medication Sig Dispense Refill   Evolocumab  (REPATHA  SURECLICK) 140 MG/ML SOAJ INJECT 140MG  SUBCUTANEOUSLY EVERY  14  DAYS 6 mL 3   lisinopril  (ZESTRIL ) 20 MG tablet Take 1 tablet by mouth once daily 90 tablet 0   metoprolol  succinate (TOPROL -XL) 50 MG 24 hr tablet TAKE 1 TABLET BY MOUTH ONCE DAILY WITH  OR  IMMEDIATELY  FOLLOWING  A  MEAL. 90 tablet 0   ondansetron  (ZOFRAN -ODT) 4 MG disintegrating tablet Take 1 tablet (4 mg total) by mouth every 8 (eight) hours as needed for nausea or vomiting. 20 tablet 0   pantoprazole  (PROTONIX ) 40 MG tablet Take 1 tablet (40 mg total) by mouth 2 (two) times daily. Please take this medication twice daily until follow up 60 tablet 3   rosuvastatin  (CRESTOR ) 10 MG tablet Take 1 tablet (10 mg total) by mouth daily. 90 tablet 2   sertraline  (ZOLOFT ) 100 MG tablet Take 1 tablet by mouth once daily 90 tablet 0   tadalafil  (CIALIS ) 20 MG tablet Take 0.5-1 tablets (10-20 mg total) by mouth every other day as needed for erectile dysfunction. 10 tablet 11   No current facility-administered medications on file prior to visit.    Review of Systems  Constitutional:  Negative for fever.  Eyes:  Negative for visual disturbance.  Respiratory:  Positive for cough. Negative for shortness of breath and wheezing.   Cardiovascular:  Negative for chest pain, palpitations and leg swelling.  Gastrointestinal:  Positive for diarrhea. Negative for abdominal pain, blood in stool and constipation.       No gerd  Genitourinary:   Negative for difficulty urinating, dysuria and hematuria.  Musculoskeletal:  Positive for back pain (chronic). Negative for arthralgias.  Skin:  Negative for rash.  Neurological:  Negative for light-headedness and headaches.  Psychiatric/Behavioral:  The patient is not nervous/anxious.        Objective:   Vitals:   12/05/23 1053  BP: 124/70  Pulse: 65  Temp: 98 F (36.7 C)  SpO2: 96%   Filed Weights   12/05/23 1053  Weight: 238 lb (108 kg)   Body mass index is 34.15 kg/m.  BP Readings from Last 3 Encounters:  12/05/23 124/70  12/05/23 124/70  10/23/23 (!) 121/58    Wt Readings from Last 3 Encounters:  12/05/23 238 lb (108 kg)  12/05/23 238 lb (108 kg)  10/23/23 234 lb (106.1 kg)      Physical Exam Constitutional: He appears well-developed and well-nourished. No distress.  HENT:  Head: Normocephalic and atraumatic.  Right Ear: External ear normal.  Left Ear: External ear normal.  Normal ear canals and TM b/l  Mouth/Throat: Oropharynx is clear and moist. Eyes: Conjunctivae and EOM are normal.  Neck: Neck supple. No tracheal deviation present. No thyromegaly present.  No carotid bruit  Cardiovascular: Normal rate, regular rhythm, normal heart sounds and intact distal pulses.   No murmur heard.  No lower extremity edema. Pulmonary/Chest: Effort normal and breath sounds normal. No  respiratory distress. He has no wheezes. He has no rales.  Abdominal: Soft. He exhibits no distension. There is no tenderness.  Genitourinary: deferred  Lymphadenopathy:   He has no cervical adenopathy.  Skin: Skin is warm and dry. He is not diaphoretic.  Psychiatric: He has a normal mood and affect. His behavior is normal.         Assessment & Plan:   Physical exam: Screening blood work  ordered Exercise   golf Weight  obese Substance abuse   drinking more alcohol than he should have been smoking-he does not anticipate stopping either as long as he continues to have increased  stress with family issues.   Reviewed recommended immunizations.  Flu immunization administered today.     Health Maintenance  Topic Date Due   Influenza Vaccine  08/22/2023   COVID-19 Vaccine (3 - Pfizer risk series) 12/21/2023 (Originally 04/22/2019)   DTaP/Tdap/Td (2 - Tdap) 12/04/2024 (Originally 06/08/2018)   Medicare Annual Wellness (AWV)  12/04/2024   Colonoscopy  08/20/2033   Pneumococcal Vaccine: 50+ Years  Completed   Hepatitis C Screening  Completed   Zoster Vaccines- Shingrix  Completed   Meningococcal B Vaccine  Aged Out     See Problem List for Assessment and Plan of chronic medical problems.

## 2023-12-05 ENCOUNTER — Ambulatory Visit: Admitting: Internal Medicine

## 2023-12-05 ENCOUNTER — Ambulatory Visit (INDEPENDENT_AMBULATORY_CARE_PROVIDER_SITE_OTHER)

## 2023-12-05 VITALS — BP 124/70 | HR 65 | Ht 70.0 in | Wt 238.0 lb

## 2023-12-05 VITALS — BP 124/70 | HR 65 | Temp 98.0°F | Ht 70.0 in | Wt 238.0 lb

## 2023-12-05 DIAGNOSIS — Z0000 Encounter for general adult medical examination without abnormal findings: Secondary | ICD-10-CM

## 2023-12-05 DIAGNOSIS — K529 Noninfective gastroenteritis and colitis, unspecified: Secondary | ICD-10-CM

## 2023-12-05 DIAGNOSIS — F17210 Nicotine dependence, cigarettes, uncomplicated: Secondary | ICD-10-CM

## 2023-12-05 DIAGNOSIS — R7303 Prediabetes: Secondary | ICD-10-CM

## 2023-12-05 DIAGNOSIS — E7800 Pure hypercholesterolemia, unspecified: Secondary | ICD-10-CM | POA: Diagnosis not present

## 2023-12-05 DIAGNOSIS — I2510 Atherosclerotic heart disease of native coronary artery without angina pectoris: Secondary | ICD-10-CM

## 2023-12-05 DIAGNOSIS — F3289 Other specified depressive episodes: Secondary | ICD-10-CM | POA: Diagnosis not present

## 2023-12-05 DIAGNOSIS — I10 Essential (primary) hypertension: Secondary | ICD-10-CM

## 2023-12-05 DIAGNOSIS — F1010 Alcohol abuse, uncomplicated: Secondary | ICD-10-CM

## 2023-12-05 DIAGNOSIS — Z125 Encounter for screening for malignant neoplasm of prostate: Secondary | ICD-10-CM

## 2023-12-05 DIAGNOSIS — Z23 Encounter for immunization: Secondary | ICD-10-CM

## 2023-12-05 DIAGNOSIS — N5201 Erectile dysfunction due to arterial insufficiency: Secondary | ICD-10-CM | POA: Diagnosis not present

## 2023-12-05 MED ORDER — TADALAFIL 20 MG PO TABS: 10.0000 mg | ORAL_TABLET | ORAL | 11 refills | Status: AC | PRN

## 2023-12-05 MED ORDER — METOPROLOL SUCCINATE ER 50 MG PO TB24: 50.0000 mg | ORAL_TABLET | Freq: Every day | ORAL | 1 refills | Status: AC

## 2023-12-05 MED ORDER — LISINOPRIL 20 MG PO TABS: 20.0000 mg | ORAL_TABLET | Freq: Every day | ORAL | 1 refills | Status: AC

## 2023-12-05 MED ORDER — SERTRALINE HCL 100 MG PO TABS: 100.0000 mg | ORAL_TABLET | Freq: Every day | ORAL | 1 refills | Status: AC

## 2023-12-05 NOTE — Addendum Note (Signed)
 Addended by: CLAUDENE TOBIAS PARAS on: 12/05/2023 03:21 PM   Modules accepted: Orders

## 2023-12-05 NOTE — Assessment & Plan Note (Signed)
 Stressed Smoking cessation

## 2023-12-05 NOTE — Assessment & Plan Note (Signed)
 Chronic Continue Cialis  10-20 mg daily as needed

## 2023-12-05 NOTE — Assessment & Plan Note (Signed)
 Chronic Continue Crestor  10 mg daily, Repatha  140 mg q. 14 days Lab Results  Component Value Date   LDLCALC 32 06/02/2023    Continue healthy diet and regular exercise Check lipid panel, CMP, TSH

## 2023-12-05 NOTE — Patient Instructions (Addendum)
 Mr. Andrew Woodard,  Thank you for taking the time for your Medicare Wellness Visit. I appreciate your continued commitment to your health goals. Please review the care plan we discussed, and feel free to reach out if I can assist you further.  Please note that Annual Wellness Visits do not include a physical exam. Some assessments may be limited, especially if the visit was conducted virtually. If needed, we may recommend an in-person follow-up with your provider.  Ongoing Care Seeing your primary care provider every 3 to 6 months helps us  monitor your health and provide consistent, personalized care. Last office visit 09/12/2023.  You are due for a Flu shot and a tetanus vaccine. Keep up the good work.  Referrals If a referral was made during today's visit and you haven't received any updates within two weeks, please contact the referred provider directly to check on the status.  Recommended Screenings:  Health Maintenance  Topic Date Due   DTaP/Tdap/Td vaccine (2 - Tdap) 06/08/2018   COVID-19 Vaccine (3 - Pfizer risk series) 04/22/2019   Flu Shot  08/22/2023   Medicare Annual Wellness Visit  12/04/2024   Colon Cancer Screening  08/20/2033   Pneumococcal Vaccine for age over 5  Completed   Hepatitis C Screening  Completed   Zoster (Shingles) Vaccine  Completed   Meningitis B Vaccine  Aged Out       12/01/2023    9:16 AM  Advanced Directives  Does Patient Have a Medical Advance Directive? No  Would patient like information on creating a medical advance directive? No - Patient declined    Vision: Annual vision screenings are recommended for early detection of glaucoma, cataracts, and diabetic retinopathy. These exams can also reveal signs of chronic conditions such as diabetes and high blood pressure.  Dental: Annual dental screenings help detect early signs of oral cancer, gum disease, and other conditions linked to overall health, including heart disease and diabetes.  Please see the  attached documents for additional preventive care recommendations.

## 2023-12-05 NOTE — Assessment & Plan Note (Signed)
 Chronic Heavy diarrhea for over 10 years No abdominal pain, blood in the stool, black stool Takes Imodium as needed-typically when he goes out to eat, which is effective Colonoscopy due 05/2022

## 2023-12-05 NOTE — Assessment & Plan Note (Signed)
 Chronic Fairly controlled-stress level is high Continue sertraline  100 mg daily

## 2023-12-05 NOTE — Progress Notes (Signed)
 Chief Complaint  Patient presents with   Medicare Wellness     Subjective:   Andrew Woodard is a 73 y.o. male who presents for a Medicare Annual Wellness Visit.  Allergies (verified) Penicillins and Wellbutrin [bupropion]   History: Past Medical History:  Diagnosis Date   ADD (attention deficit disorder)    Allergy    Anxiety    Arthritis    Cancer (HCC)    skin cancers   Colon polyps    Depression    Fasting hyperglycemia    Hyperlipidemia    hx of   Hypertension    Pneumonia    Past Surgical History:  Procedure Laterality Date   ABSCESS DRAINAGE  2012   L thigh, Dr Mikell (not MRSA)   COLONOSCOPY  2014   colonoscopy with polypectomy  2007   Dr Abran   CYSTECTOMY  2011   L knee, Dr Shari   [facility]  2013    Dr Eldonna   ganglionectomy     PILONIDAL CYST EXCISION     X 2   VASECTOMY     X 2; Dr Ottelin   Family History  Problem Relation Age of Onset   Alzheimer's disease Mother    Depression Father    Kidney disease Father        ESRD   Colon cancer Maternal Grandmother 92   Heart attack Maternal Grandfather 76        pacer   Heart attack Paternal Grandfather 59   Stroke Neg Hx    Diabetes Neg Hx    Esophageal cancer Neg Hx    Rectal cancer Neg Hx    Stomach cancer Neg Hx    Social History   Occupational History   Occupation: retired  Tobacco Use   Smoking status: Every Day    Current packs/day: 1.00    Types: Cigarettes   Smokeless tobacco: Never   Tobacco comments:    smokedage18- present , up to 2 ppd for 10 years. 08/09/14 6-7 cigarettes per day.  Per pt-1 pack a day/20  Vaping Use   Vaping status: Never Used  Substance and Sexual Activity   Alcohol use: Yes    Comment: 28-32  glasses/ week of wine   Drug use: No   Sexual activity: Not on file   Tobacco Counseling Ready to quit: Not Answered Counseling given: Not Answered Tobacco comments: smokedage18- present , up to 2 ppd for 10 years. 08/09/14 6-7 cigarettes per day.  Per pt-1  pack a day/20  SDOH Screenings   Food Insecurity: No Food Insecurity (12/01/2023)  Housing: Low Risk  (12/01/2023)  Transportation Needs: No Transportation Needs (12/01/2023)  Utilities: Not At Risk (12/05/2023)  Alcohol Screen: Medium Risk (12/01/2023)  Depression (PHQ2-9): Low Risk  (12/05/2023)  Financial Resource Strain: Low Risk  (12/01/2023)  Physical Activity: Sufficiently Active (12/01/2023)  Social Connections: Moderately Integrated (12/01/2023)  Stress: No Stress Concern Present (12/01/2023)  Tobacco Use: High Risk (12/05/2023)  Health Literacy: Adequate Health Literacy (11/29/2022)   See flowsheets for full screening details  Depression Screen PHQ 2 & 9 Depression Scale- Over the past 2 weeks, how often have you been bothered by any of the following problems? Little interest or pleasure in doing things: 0 Feeling down, depressed, or hopeless (PHQ Adolescent also includes...irritable): 2 (feeling down sometimes) PHQ-2 Total Score: 2 Trouble falling or staying asleep, or sleeping too much: 0 Feeling tired or having little energy: 0 Poor appetite or overeating (PHQ Adolescent also  includes...weight loss): 0 Feeling bad about yourself - or that you are a failure or have let yourself or your family down: 0 Trouble concentrating on things, such as reading the newspaper or watching television (PHQ Adolescent also includes...like school work): 0 Moving or speaking so slowly that other people could have noticed. Or the opposite - being so fidgety or restless that you have been moving around a lot more than usual: 0 Thoughts that you would be better off dead, or of hurting yourself in some way: 0 PHQ-9 Total Score: 2 If you checked off any problems, how difficult have these problems made it for you to do your work, take care of things at home, or get along with other people?: Not difficult at all  Depression Treatment Depression Interventions/Treatment : EYV7-0 Score <4 Follow-up  Not Indicated     Goals Addressed               This Visit's Progress     Patient Stated (pt-stated)   On track     My goal is to increase my physical activity by walking more.       Visit info / Clinical Intake: Medicare Wellness Visit Type:: Subsequent Annual Wellness Visit Persons participating in visit:: patient Medicare Wellness Visit Mode:: In-person (required for WTM) Information given by:: patient Interpreter Needed?: No Pre-visit prep was completed: yes (12/01/2023) AWV questionnaire completed by patient prior to visit?: yes Living arrangements:: lives with spouse/significant other Patient's Overall Health Status Rating: very good Typical amount of pain: none Does pain affect daily life?: no Are you currently prescribed opioids?: no  Dietary Habits and Nutritional Risks How many meals a day?: 3 (someties skips lunch) Eats fruit and vegetables daily?: yes Most meals are obtained by: preparing own meals In the last 2 weeks, have you had any of the following?: (!) nausea, vomiting, diarrhea (has diarrhea) Diabetic:: no  Functional Status Activities of Daily Living (to include ambulation/medication): (Patient-Rptd) Independent Ambulation: Independent with device- listed below Home Assistive Devices/Equipment: Eyeglasses Medication Administration: Independent Home Management: (Patient-Rptd) Independent Manage your own finances?: yes Primary transportation is: driving Concerns about vision?: no *vision screening is required for WTM* Concerns about hearing?: no  Fall Screening Falls in the past year?: 1 (pasted out once) Number of falls in past year: (Patient-Rptd) 0 Was there an injury with Fall?: (Patient-Rptd) 0 Fall Risk Category Calculator: (Patient-Rptd) 1 Patient Fall Risk Level: (Patient-Rptd) Low Fall Risk  Fall Risk Patient at Risk for Falls Due to: Other (Comment) Fall risk Follow up: Falls evaluation completed; Falls prevention discussed  Home  and Transportation Safety: All rugs have non-skid backing?: yes All stairs or steps have railings?: yes Grab bars in the bathtub or shower?: yes Have non-skid surface in bathtub or shower?: (!) no Good home lighting?: yes Regular seat belt use?: yes Hospital stays in the last year:: no  Cognitive Assessment Difficulty concentrating, remembering, or making decisions? : no Will 6CIT or Mini Cog be Completed: yes What year is it?: 0 points What month is it?: 0 points Give patient an address phrase to remember (5 components): [address], [place], [place] About what time is it?: 0 points Count backwards from 20 to 1: 0 points Say the months of the year in reverse: 0 points Repeat the address phrase from earlier: 0 points 6 CIT Score: 0 points  Advance Directives (For Healthcare) Does Patient Have a Medical Advance Directive?: No (working on it) Would patient like information on creating a medical  advance directive?: No - Patient declined  Reviewed/Updated  Reviewed/Updated: Reviewed All (Medical, Surgical, Family, Medications, Allergies, Care Teams, Patient Goals)        Objective:    Today's Vitals   12/05/23 1010  BP: 124/70  Pulse: 65  SpO2: 96%  Weight: 238 lb (108 kg)  Height: 5' 10 (1.778 m)   Body mass index is 34.15 kg/m.  Current Medications (verified) Outpatient Encounter Medications as of 12/05/2023  Medication Sig   Evolocumab  (REPATHA  SURECLICK) 140 MG/ML SOAJ INJECT 140MG  SUBCUTANEOUSLY EVERY  14  DAYS   lisinopril  (ZESTRIL ) 20 MG tablet Take 1 tablet by mouth once daily   metoprolol  succinate (TOPROL -XL) 50 MG 24 hr tablet TAKE 1 TABLET BY MOUTH ONCE DAILY WITH  OR  IMMEDIATELY  FOLLOWING  A  MEAL.   ondansetron  (ZOFRAN -ODT) 4 MG disintegrating tablet Take 1 tablet (4 mg total) by mouth every 8 (eight) hours as needed for nausea or vomiting.   pantoprazole  (PROTONIX ) 40 MG tablet Take 1 tablet (40 mg total) by mouth 2 (two) times daily. Please take this  medication twice daily until follow up   rosuvastatin  (CRESTOR ) 10 MG tablet Take 1 tablet (10 mg total) by mouth daily.   sertraline  (ZOLOFT ) 100 MG tablet Take 1 tablet by mouth once daily   tadalafil  (CIALIS ) 20 MG tablet Take 0.5-1 tablets (10-20 mg total) by mouth every other day as needed for erectile dysfunction.   No facility-administered encounter medications on file as of 12/05/2023.   Hearing/Vision screen Hearing Screening - Comments:: Denies hearing difficulties   Vision Screening - Comments:: Wear eyeglasses for drives/Dr. Lyles/UTD Immunizations and Health Maintenance Health Maintenance  Topic Date Due   DTaP/Tdap/Td (2 - Tdap) 06/08/2018   COVID-19 Vaccine (3 - Pfizer risk series) 04/22/2019   Influenza Vaccine  08/22/2023   Medicare Annual Wellness (AWV)  12/04/2024   Colonoscopy  08/20/2033   Pneumococcal Vaccine: 50+ Years  Completed   Hepatitis C Screening  Completed   Zoster Vaccines- Shingrix  Completed   Meningococcal B Vaccine  Aged Out        Assessment/Plan:  This is a routine wellness examination for Andrew Woodard.  Patient Care Team: Geofm Glade PARAS, MD as PCP - General (Internal Medicine) Loni Soyla LABOR, MD as PCP - Cardiology (Cardiology) Rosan Credit, MD as Consulting Physician (Ophthalmology) Charmayne Molly, MD as Consulting Physician (Ophthalmology)  I have personally reviewed and noted the following in the patient's chart:   Medical and social history Use of alcohol, tobacco or illicit drugs  Current medications and supplements including opioid prescriptions. Functional ability and status Nutritional status Physical activity Advanced directives List of other physicians Hospitalizations, surgeries, and ER visits in previous 12 months Vitals Screenings to include cognitive, depression, and falls Referrals and appointments  No orders of the defined types were placed in this encounter.  In addition, I have reviewed and discussed with  patient certain preventive protocols, quality metrics, and best practice recommendations. A written personalized care plan for preventive services as well as general preventive health recommendations were provided to patient.   Kalee Broxton L Keanon Bevins, CMA   12/05/2023   Return in 1 year (on 12/04/2024).  After Visit Summary: (MyChart) Due to this being a telephonic visit, the after visit summary with patients personalized plan was offered to patient via MyChart   Nurse Notes: Patient is due for a flu vaccine, however, he would like to discuss with Dr. Geofm during his office visit today.  He is also  due for a Tdap.  Patient had no other concerns to address today.

## 2023-12-05 NOTE — Assessment & Plan Note (Signed)
 Chronic Seen on CT scan-CT CAC 2253 Asymptomatic Following with cardiology On Repatha , rosuvastatin  10 mg daily, aspirin 81 mg  Lab Results  Component Value Date   LDLCALC 32 06/02/2023    Encouraged smoking cessation, alcohol cessation, regular exercise

## 2023-12-05 NOTE — Assessment & Plan Note (Addendum)
 Chronic Lab Results  Component Value Date   HGBA1C 5.7 06/02/2023   Check a1c Encouraged regular exercise Stressed low sugar/carbohydrate diet and regular exercise

## 2023-12-05 NOTE — Assessment & Plan Note (Signed)
 Chronic Blood pressure well controlled CBC, CMP Continue lisinopril  20 mg daily, metoprolol  XL 50 mg daily

## 2023-12-05 NOTE — Assessment & Plan Note (Addendum)
 Chronic Drinks regularly and more than he should He admits he will not likely be able to decrease his alcohol intake as long as he has been eating family stresses

## 2024-04-08 ENCOUNTER — Ambulatory Visit: Admitting: Internal Medicine

## 2024-06-04 ENCOUNTER — Ambulatory Visit: Admitting: Internal Medicine

## 2024-12-07 ENCOUNTER — Ambulatory Visit

## 2024-12-07 ENCOUNTER — Encounter: Admitting: Internal Medicine
